# Patient Record
Sex: Female | Born: 1955
Health system: Southern US, Community
[De-identification: ages and names within clinical notes are randomized; demographics above are authoritative.]

## PROBLEM LIST (undated history)

## (undated) DIAGNOSIS — D649 Anemia, unspecified: Secondary | ICD-10-CM

## (undated) DIAGNOSIS — F329 Major depressive disorder, single episode, unspecified: Secondary | ICD-10-CM

## (undated) DIAGNOSIS — F32A Depression, unspecified: Secondary | ICD-10-CM

## (undated) DIAGNOSIS — U071 COVID-19: Secondary | ICD-10-CM

## (undated) DIAGNOSIS — E039 Hypothyroidism, unspecified: Secondary | ICD-10-CM

## (undated) DIAGNOSIS — K219 Gastro-esophageal reflux disease without esophagitis: Secondary | ICD-10-CM

## (undated) DIAGNOSIS — I10 Essential (primary) hypertension: Secondary | ICD-10-CM

## (undated) HISTORY — PX: EYE SURGERY: SHX253

## (undated) HISTORY — DX: Depression, unspecified: F32.A

## (undated) HISTORY — DX: Anemia, unspecified: D64.9

## (undated) HISTORY — DX: Major depressive disorder, single episode, unspecified: F32.9

## (undated) HISTORY — DX: Essential (primary) hypertension: I10

---
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
## 1997-12-18 ENCOUNTER — Encounter: Admission: RE | Admit: 1997-12-18 | Discharge: 1997-12-18 | Payer: Self-pay | Admitting: Family Medicine

## 1998-01-02 ENCOUNTER — Encounter: Admission: RE | Admit: 1998-01-02 | Discharge: 1998-01-02 | Payer: Self-pay | Admitting: Family Medicine

## 1998-01-25 ENCOUNTER — Encounter: Admission: RE | Admit: 1998-01-25 | Discharge: 1998-01-25 | Payer: Self-pay | Admitting: Family Medicine

## 1998-04-06 ENCOUNTER — Encounter: Admission: RE | Admit: 1998-04-06 | Discharge: 1998-04-06 | Payer: Self-pay | Admitting: Family Medicine

## 1999-02-20 ENCOUNTER — Encounter: Admission: RE | Admit: 1999-02-20 | Discharge: 1999-02-20 | Payer: Self-pay | Admitting: Family Medicine

## 1999-08-20 ENCOUNTER — Encounter: Admission: RE | Admit: 1999-08-20 | Discharge: 1999-08-20 | Payer: Self-pay | Admitting: Sports Medicine

## 1999-09-23 ENCOUNTER — Emergency Department (HOSPITAL_COMMUNITY): Admission: EM | Admit: 1999-09-23 | Discharge: 1999-09-24 | Payer: Self-pay | Admitting: Emergency Medicine

## 1999-09-24 ENCOUNTER — Encounter: Payer: Self-pay | Admitting: Emergency Medicine

## 2000-06-08 ENCOUNTER — Encounter: Admission: RE | Admit: 2000-06-08 | Discharge: 2000-06-08 | Payer: Self-pay | Admitting: Family Medicine

## 2000-09-21 ENCOUNTER — Encounter: Admission: RE | Admit: 2000-09-21 | Discharge: 2000-09-21 | Payer: Self-pay | Admitting: Family Medicine

## 2000-11-02 ENCOUNTER — Encounter: Admission: RE | Admit: 2000-11-02 | Discharge: 2000-11-02 | Payer: Self-pay | Admitting: Family Medicine

## 2000-12-03 ENCOUNTER — Emergency Department (HOSPITAL_COMMUNITY): Admission: EM | Admit: 2000-12-03 | Discharge: 2000-12-03 | Payer: Self-pay | Admitting: Emergency Medicine

## 2001-11-08 ENCOUNTER — Encounter: Admission: RE | Admit: 2001-11-08 | Discharge: 2001-11-08 | Payer: Self-pay | Admitting: Family Medicine

## 2001-11-18 ENCOUNTER — Encounter: Admission: RE | Admit: 2001-11-18 | Discharge: 2001-11-18 | Payer: Self-pay | Admitting: Family Medicine

## 2001-11-22 ENCOUNTER — Encounter: Admission: RE | Admit: 2001-11-22 | Discharge: 2001-11-22 | Payer: Self-pay | Admitting: Family Medicine

## 2002-10-25 ENCOUNTER — Encounter: Admission: RE | Admit: 2002-10-25 | Discharge: 2002-10-25 | Payer: Self-pay | Admitting: Family Medicine

## 2002-10-31 ENCOUNTER — Encounter: Admission: RE | Admit: 2002-10-31 | Discharge: 2002-10-31 | Payer: Self-pay | Admitting: Family Medicine

## 2002-11-28 ENCOUNTER — Emergency Department (HOSPITAL_COMMUNITY): Admission: EM | Admit: 2002-11-28 | Discharge: 2002-11-28 | Payer: Self-pay | Admitting: *Deleted

## 2003-12-25 ENCOUNTER — Encounter: Admission: RE | Admit: 2003-12-25 | Discharge: 2003-12-25 | Payer: Self-pay | Admitting: Family Medicine

## 2004-10-15 ENCOUNTER — Ambulatory Visit: Payer: Self-pay | Admitting: Sports Medicine

## 2004-12-18 ENCOUNTER — Ambulatory Visit: Payer: Self-pay | Admitting: Family Medicine

## 2005-01-03 ENCOUNTER — Encounter: Admission: RE | Admit: 2005-01-03 | Discharge: 2005-01-03 | Payer: Self-pay | Admitting: Sports Medicine

## 2005-01-06 ENCOUNTER — Encounter (INDEPENDENT_AMBULATORY_CARE_PROVIDER_SITE_OTHER): Payer: Self-pay | Admitting: *Deleted

## 2005-01-06 LAB — CONVERTED CEMR LAB

## 2005-01-17 ENCOUNTER — Ambulatory Visit: Payer: Self-pay | Admitting: Family Medicine

## 2005-04-17 ENCOUNTER — Ambulatory Visit: Payer: Self-pay | Admitting: Family Medicine

## 2005-09-19 ENCOUNTER — Ambulatory Visit: Payer: Self-pay | Admitting: Sports Medicine

## 2005-09-19 ENCOUNTER — Ambulatory Visit (HOSPITAL_COMMUNITY): Admission: RE | Admit: 2005-09-19 | Discharge: 2005-09-19 | Payer: Self-pay | Admitting: Sports Medicine

## 2005-12-15 ENCOUNTER — Emergency Department (HOSPITAL_COMMUNITY): Admission: EM | Admit: 2005-12-15 | Discharge: 2005-12-15 | Payer: Self-pay | Admitting: Emergency Medicine

## 2006-01-05 ENCOUNTER — Ambulatory Visit: Payer: Self-pay | Admitting: Sports Medicine

## 2006-01-14 ENCOUNTER — Ambulatory Visit: Payer: Self-pay | Admitting: Sports Medicine

## 2006-01-19 ENCOUNTER — Ambulatory Visit: Payer: Self-pay | Admitting: Family Medicine

## 2006-02-03 ENCOUNTER — Ambulatory Visit: Payer: Self-pay | Admitting: Sports Medicine

## 2006-02-10 ENCOUNTER — Encounter: Admission: RE | Admit: 2006-02-10 | Discharge: 2006-02-10 | Payer: Self-pay | Admitting: *Deleted

## 2006-04-20 ENCOUNTER — Emergency Department (HOSPITAL_COMMUNITY): Admission: EM | Admit: 2006-04-20 | Discharge: 2006-04-21 | Payer: Self-pay | Admitting: Emergency Medicine

## 2006-04-22 ENCOUNTER — Emergency Department (HOSPITAL_COMMUNITY): Admission: EM | Admit: 2006-04-22 | Discharge: 2006-04-22 | Payer: Self-pay | Admitting: Family Medicine

## 2006-04-23 ENCOUNTER — Ambulatory Visit: Payer: Self-pay | Admitting: Family Medicine

## 2006-05-15 ENCOUNTER — Ambulatory Visit: Payer: Self-pay | Admitting: Family Medicine

## 2006-06-10 ENCOUNTER — Ambulatory Visit (HOSPITAL_COMMUNITY): Admission: RE | Admit: 2006-06-10 | Discharge: 2006-06-10 | Payer: Self-pay | Admitting: Sports Medicine

## 2006-06-15 ENCOUNTER — Ambulatory Visit: Payer: Self-pay | Admitting: Family Medicine

## 2006-10-07 ENCOUNTER — Ambulatory Visit: Payer: Self-pay | Admitting: Family Medicine

## 2006-10-07 ENCOUNTER — Encounter (INDEPENDENT_AMBULATORY_CARE_PROVIDER_SITE_OTHER): Payer: Self-pay | Admitting: *Deleted

## 2006-10-07 LAB — CONVERTED CEMR LAB: TSH: 4.607 microintl units/mL (ref 0.350–5.50)

## 2006-10-09 ENCOUNTER — Ambulatory Visit: Payer: Self-pay | Admitting: Family Medicine

## 2006-10-16 ENCOUNTER — Ambulatory Visit: Payer: Self-pay | Admitting: Family Medicine

## 2006-10-19 ENCOUNTER — Encounter: Payer: Self-pay | Admitting: Family Medicine

## 2006-10-19 ENCOUNTER — Ambulatory Visit: Payer: Self-pay | Admitting: Family Medicine

## 2006-10-19 LAB — CONVERTED CEMR LAB
Alkaline Phosphatase: 83 units/L (ref 39–117)
Calcium: 10.2 mg/dL (ref 8.4–10.5)
Sodium: 140 meq/L (ref 135–145)

## 2006-10-23 ENCOUNTER — Ambulatory Visit: Payer: Self-pay | Admitting: Family Medicine

## 2006-11-05 DIAGNOSIS — E039 Hypothyroidism, unspecified: Secondary | ICD-10-CM | POA: Insufficient documentation

## 2006-11-05 DIAGNOSIS — I10 Essential (primary) hypertension: Secondary | ICD-10-CM | POA: Insufficient documentation

## 2006-11-05 DIAGNOSIS — K219 Gastro-esophageal reflux disease without esophagitis: Secondary | ICD-10-CM | POA: Insufficient documentation

## 2006-11-05 DIAGNOSIS — E669 Obesity, unspecified: Secondary | ICD-10-CM | POA: Insufficient documentation

## 2006-11-05 DIAGNOSIS — F329 Major depressive disorder, single episode, unspecified: Secondary | ICD-10-CM

## 2006-11-06 ENCOUNTER — Encounter (INDEPENDENT_AMBULATORY_CARE_PROVIDER_SITE_OTHER): Payer: Self-pay | Admitting: *Deleted

## 2007-02-05 ENCOUNTER — Telehealth: Payer: Self-pay | Admitting: Family Medicine

## 2007-02-10 ENCOUNTER — Telehealth: Payer: Self-pay | Admitting: *Deleted

## 2007-02-15 ENCOUNTER — Encounter: Payer: Self-pay | Admitting: *Deleted

## 2007-03-03 ENCOUNTER — Ambulatory Visit: Payer: Self-pay | Admitting: Family Medicine

## 2007-03-03 DIAGNOSIS — F411 Generalized anxiety disorder: Secondary | ICD-10-CM | POA: Insufficient documentation

## 2007-03-03 LAB — CONVERTED CEMR LAB
Glucose, Urine, Semiquant: NEGATIVE
Ketones, urine, test strip: NEGATIVE
WBC Urine, dipstick: NEGATIVE

## 2007-05-21 ENCOUNTER — Encounter (INDEPENDENT_AMBULATORY_CARE_PROVIDER_SITE_OTHER): Payer: Self-pay | Admitting: *Deleted

## 2007-06-02 ENCOUNTER — Telehealth (INDEPENDENT_AMBULATORY_CARE_PROVIDER_SITE_OTHER): Payer: Self-pay | Admitting: Family Medicine

## 2007-06-02 ENCOUNTER — Encounter (INDEPENDENT_AMBULATORY_CARE_PROVIDER_SITE_OTHER): Payer: Self-pay | Admitting: *Deleted

## 2007-06-02 ENCOUNTER — Ambulatory Visit: Payer: Self-pay | Admitting: Family Medicine

## 2007-06-02 ENCOUNTER — Telehealth (INDEPENDENT_AMBULATORY_CARE_PROVIDER_SITE_OTHER): Payer: Self-pay | Admitting: *Deleted

## 2007-06-02 LAB — CONVERTED CEMR LAB: TSH: 2.508 microintl units/mL (ref 0.350–5.50)

## 2007-06-03 ENCOUNTER — Encounter (INDEPENDENT_AMBULATORY_CARE_PROVIDER_SITE_OTHER): Payer: Self-pay | Admitting: *Deleted

## 2007-08-10 ENCOUNTER — Telehealth (INDEPENDENT_AMBULATORY_CARE_PROVIDER_SITE_OTHER): Payer: Self-pay | Admitting: *Deleted

## 2007-08-18 ENCOUNTER — Emergency Department (HOSPITAL_COMMUNITY): Admission: EM | Admit: 2007-08-18 | Discharge: 2007-08-18 | Payer: Self-pay | Admitting: Emergency Medicine

## 2007-12-02 ENCOUNTER — Telehealth (INDEPENDENT_AMBULATORY_CARE_PROVIDER_SITE_OTHER): Payer: Self-pay | Admitting: *Deleted

## 2007-12-29 ENCOUNTER — Ambulatory Visit: Payer: Self-pay | Admitting: Family Medicine

## 2007-12-29 ENCOUNTER — Ambulatory Visit (HOSPITAL_COMMUNITY): Admission: RE | Admit: 2007-12-29 | Discharge: 2007-12-29 | Payer: Self-pay | Admitting: Family Medicine

## 2007-12-29 ENCOUNTER — Encounter: Payer: Self-pay | Admitting: *Deleted

## 2007-12-29 ENCOUNTER — Encounter (INDEPENDENT_AMBULATORY_CARE_PROVIDER_SITE_OTHER): Payer: Self-pay | Admitting: Family Medicine

## 2007-12-29 DIAGNOSIS — R079 Chest pain, unspecified: Secondary | ICD-10-CM

## 2007-12-31 ENCOUNTER — Encounter (INDEPENDENT_AMBULATORY_CARE_PROVIDER_SITE_OTHER): Payer: Self-pay | Admitting: *Deleted

## 2008-01-03 ENCOUNTER — Telehealth: Payer: Self-pay | Admitting: *Deleted

## 2008-01-04 ENCOUNTER — Encounter: Payer: Self-pay | Admitting: *Deleted

## 2008-01-07 ENCOUNTER — Encounter: Payer: Self-pay | Admitting: *Deleted

## 2008-01-07 ENCOUNTER — Ambulatory Visit: Payer: Self-pay | Admitting: Family Medicine

## 2008-01-12 ENCOUNTER — Encounter (INDEPENDENT_AMBULATORY_CARE_PROVIDER_SITE_OTHER): Payer: Self-pay | Admitting: *Deleted

## 2008-01-26 ENCOUNTER — Encounter: Payer: Self-pay | Admitting: Family Medicine

## 2008-01-26 ENCOUNTER — Ambulatory Visit: Payer: Self-pay | Admitting: Family Medicine

## 2008-01-26 ENCOUNTER — Encounter (INDEPENDENT_AMBULATORY_CARE_PROVIDER_SITE_OTHER): Payer: Self-pay | Admitting: *Deleted

## 2008-01-26 DIAGNOSIS — G4700 Insomnia, unspecified: Secondary | ICD-10-CM

## 2008-01-27 LAB — CONVERTED CEMR LAB
AST: 19 units/L (ref 0–37)
Alkaline Phosphatase: 76 units/L (ref 39–117)
BUN: 16 mg/dL (ref 6–23)
Calcium: 9.8 mg/dL (ref 8.4–10.5)
Creatinine, Ser: 1.19 mg/dL (ref 0.40–1.20)
Glucose, Bld: 110 mg/dL — ABNORMAL HIGH (ref 70–99)
HDL: 58 mg/dL (ref >39)
LDL Cholesterol: 90 mg/dL (ref 0–99)
TSH: 5.626 microintl units/mL — ABNORMAL HIGH (ref 0.350–5.50)
Total CHOL/HDL Ratio: 2.8
Triglycerides: 69 mg/dL (ref <150)

## 2008-02-03 ENCOUNTER — Ambulatory Visit: Payer: Self-pay | Admitting: Cardiology

## 2008-02-09 ENCOUNTER — Telehealth: Payer: Self-pay | Admitting: *Deleted

## 2008-02-22 ENCOUNTER — Ambulatory Visit: Payer: Self-pay

## 2008-02-22 ENCOUNTER — Encounter (INDEPENDENT_AMBULATORY_CARE_PROVIDER_SITE_OTHER): Payer: Self-pay | Admitting: *Deleted

## 2008-02-24 ENCOUNTER — Encounter (INDEPENDENT_AMBULATORY_CARE_PROVIDER_SITE_OTHER): Payer: Self-pay | Admitting: *Deleted

## 2008-02-24 ENCOUNTER — Ambulatory Visit: Payer: Self-pay

## 2008-06-19 ENCOUNTER — Emergency Department (HOSPITAL_COMMUNITY): Admission: EM | Admit: 2008-06-19 | Discharge: 2008-06-19 | Payer: Self-pay | Admitting: Family Medicine

## 2008-11-09 ENCOUNTER — Encounter: Payer: Self-pay | Admitting: Family Medicine

## 2008-11-10 ENCOUNTER — Ambulatory Visit: Payer: Self-pay | Admitting: Family Medicine

## 2008-11-10 ENCOUNTER — Encounter: Payer: Self-pay | Admitting: Family Medicine

## 2008-11-10 LAB — CONVERTED CEMR LAB
BUN: 13 mg/dL (ref 6–23)
Chloride: 108 meq/L (ref 96–112)
Creatinine, Ser: 1.19 mg/dL (ref 0.40–1.20)
Glucose, Bld: 108 mg/dL — ABNORMAL HIGH (ref 70–99)
Potassium: 4.1 meq/L (ref 3.5–5.3)

## 2008-11-16 ENCOUNTER — Telehealth: Payer: Self-pay | Admitting: *Deleted

## 2008-12-15 ENCOUNTER — Telehealth: Payer: Self-pay | Admitting: Family Medicine

## 2008-12-29 ENCOUNTER — Ambulatory Visit: Payer: Self-pay | Admitting: Family Medicine

## 2008-12-29 ENCOUNTER — Encounter: Payer: Self-pay | Admitting: Family Medicine

## 2009-01-01 LAB — CONVERTED CEMR LAB: TSH: 7.267 microintl units/mL — ABNORMAL HIGH (ref 0.350–4.500)

## 2009-01-18 ENCOUNTER — Ambulatory Visit: Payer: Self-pay | Admitting: Family Medicine

## 2009-01-22 ENCOUNTER — Telehealth: Payer: Self-pay | Admitting: *Deleted

## 2009-02-06 ENCOUNTER — Telehealth: Payer: Self-pay | Admitting: *Deleted

## 2009-02-07 ENCOUNTER — Ambulatory Visit: Payer: Self-pay | Admitting: Family Medicine

## 2009-02-13 ENCOUNTER — Ambulatory Visit: Payer: Self-pay | Admitting: Family Medicine

## 2009-02-13 ENCOUNTER — Telehealth: Payer: Self-pay | Admitting: Family Medicine

## 2009-03-01 ENCOUNTER — Telehealth: Payer: Self-pay | Admitting: *Deleted

## 2009-09-19 ENCOUNTER — Telehealth: Payer: Self-pay | Admitting: Family Medicine

## 2009-10-08 ENCOUNTER — Emergency Department (HOSPITAL_COMMUNITY): Admission: EM | Admit: 2009-10-08 | Discharge: 2009-10-08 | Payer: Self-pay | Admitting: Emergency Medicine

## 2009-10-19 ENCOUNTER — Telehealth: Payer: Self-pay | Admitting: Family Medicine

## 2009-10-24 ENCOUNTER — Ambulatory Visit: Payer: Self-pay | Admitting: Family Medicine

## 2009-10-24 ENCOUNTER — Telehealth: Payer: Self-pay | Admitting: Family Medicine

## 2009-10-24 DIAGNOSIS — Z87448 Personal history of other diseases of urinary system: Secondary | ICD-10-CM | POA: Insufficient documentation

## 2009-10-24 LAB — CONVERTED CEMR LAB
Blood in Urine, dipstick: NEGATIVE
Ketones, urine, test strip: NEGATIVE
Nitrite: NEGATIVE
Specific Gravity, Urine: 1.01
Urobilinogen, UA: 0.2
WBC Urine, dipstick: NEGATIVE

## 2010-04-05 ENCOUNTER — Ambulatory Visit: Payer: Self-pay | Admitting: Family Medicine

## 2010-04-05 ENCOUNTER — Encounter: Payer: Self-pay | Admitting: Family Medicine

## 2010-04-05 LAB — CONVERTED CEMR LAB
AST: 23 units/L (ref 0–37)
Albumin: 4.3 g/dL (ref 3.5–5.2)
Alkaline Phosphatase: 79 units/L (ref 39–117)
BUN: 22 mg/dL (ref 6–23)
Creatinine, Ser: 1.34 mg/dL — ABNORMAL HIGH (ref 0.40–1.20)
Glucose, Bld: 105 mg/dL — ABNORMAL HIGH (ref 70–99)
HCT: 30.7 % — ABNORMAL LOW (ref 36.0–46.0)
HDL: 54 mg/dL (ref >39)
Hemoglobin: 10.3 g/dL — ABNORMAL LOW (ref 12.0–15.0)
LDL Cholesterol: 107 mg/dL — ABNORMAL HIGH (ref 0–99)
MCHC: 33.6 g/dL (ref 30.0–36.0)
MCV: 83.9 fL (ref 78.0–100.0)
RBC: 3.66 M/uL — ABNORMAL LOW (ref 3.87–5.11)
RDW: 13.8 % (ref 11.5–15.5)
TSH: 8.01 microintl units/mL — ABNORMAL HIGH (ref 0.350–4.500)
Total Bilirubin: 0.3 mg/dL (ref 0.3–1.2)
Total CHOL/HDL Ratio: 3.2
Triglycerides: 52 mg/dL (ref <150)

## 2010-04-08 ENCOUNTER — Encounter: Payer: Self-pay | Admitting: Family Medicine

## 2010-04-08 DIAGNOSIS — D649 Anemia, unspecified: Secondary | ICD-10-CM

## 2010-04-08 LAB — CONVERTED CEMR LAB: TIBC: 305 ug/dL (ref 250–470)

## 2010-04-15 ENCOUNTER — Telehealth: Payer: Self-pay | Admitting: Family Medicine

## 2010-04-15 DIAGNOSIS — N183 Chronic kidney disease, stage 3 (moderate): Secondary | ICD-10-CM

## 2010-08-10 ENCOUNTER — Telehealth: Payer: Self-pay | Admitting: Family Medicine

## 2010-09-29 ENCOUNTER — Encounter: Payer: Self-pay | Admitting: Family Medicine

## 2010-10-10 NOTE — Progress Notes (Signed)
   Phone Note Call from Patient   Caller: Patient Summary of Call: Patient complains of feeling tired, "feels drunk."  Stated she ran out of Synthroid on Tuesday of this week.  Started feeling bad on Thursday.  Complains of tiredness, headache.  Has tried taking Tylenol with inconsistent relief for headache.  Would like several day refill of Synthroid until she can be seen this week.  Will refill for 1 week and told patient to call Mon for appt.  Patient agreed with plan, states she will call first thing Monday.  Initial call taken by: Renold Don MD,  August 10, 2010 2:15 PM    Prescriptions: SYNTHROID 125 MCG TABS (LEVOTHYROXINE SODIUM) Take 1 tablet by mouth once a day.  #7 x 0   Entered and Authorized by:   Renold Don MD   Signed by:   Renold Don MD on 08/10/2010   Method used:   Electronically to        [facility] [phone]* (retail)       [address]       [place], [place]  [address]       Ph: [phone]       Fax: [phone]   RxID:   [id number]

## 2010-10-10 NOTE — Progress Notes (Signed)
Summary: triage: UTI   Phone Note Call from Patient Call back at Home Phone [phone]   Caller: Patient Summary of Call: was sick last week and went to ED for UTI and gave her Cephalexin 500mg  thinks she is allergic to is and wants another antibiotic for this [facility] [address] Initial call taken by: De Nurse,  October 19, 2009 10:19 AM  Follow-up for Phone Call        states she had a back ache & her throat was closing up when she was taking the antibiotic. stopped the pill & the symptoms went away. appt made for 3pm for recheck.   pcp to add this to her allergies Follow-up by: Golden Circle RN,  October 19, 2009 10:27 AM

## 2010-10-10 NOTE — Progress Notes (Signed)
Summary: triage   Phone Note Call from Patient Call back at Home Phone [phone]   Caller: Patient Summary of Call: Pt thinks she has a UTI can she be worked in? Initial call taken by: Clydell Hakim,  October 24, 2009 9:46 AM  Follow-up for Phone Call        c/o UTI symptoms. had been seen in ED for this but stopped the antibiotics due to an allergic reax. needs to be seen to see if she still has it & to get alternative med  Follow-up by: Golden Circle RN,  October 24, 2009 9:51 AM

## 2010-10-10 NOTE — Assessment & Plan Note (Signed)
Summary: f/up,tcb   Vital Signs:  Patient profile:   55 year old female Height:      61.5 inches Weight:      249 pounds BMI:     46.45 Temp:     98.3 degrees F oral Pulse rate:   67 / minute BP sitting:   146 / 82  (left arm) Cuff size:   large  Vitals Entered By: Garen Grams LPN (April 05, 2010 9:01 AM)  Serial Vital Signs/Assessments:  Time      Position  BP       Pulse  Resp  Temp     By                     130/80                         Delbert Harness MD  CC: f/u meds Is Patient Diabetic? No Pain Assessment Patient in pain? no        Primary Care Provider:  Delbert Harness MD  CC:  f/u meds.  History of Present Illness: 55 yo here for follow-up of chronic medical conditions  HYPERTENSION Meds: Taking and tolerating? yes Home BP's: yes- usually in 130/80 range per patient Chest Pain: no Dyspnea: no  hypothyroidism:  has been out of meds for one week.   No skin/hair changes, temp intolerance, constipation, fatigue.  depression/anxiety:  feels like she is doing well and would like to continue stable dose of fluoxetine.  Habits & Providers  Alcohol-Tobacco-Diet     Tobacco Status: never  Current Medications (verified): 1)  Synthroid 125 Mcg Tabs (Levothyroxine Sodium) .... Take 1 Tablet By Mouth Once A Day. 2)  Lisinopril-Hydrochlorothiazide 20-12.5 Mg Tabs (Lisinopril-Hydrochlorothiazide) .... 2 Tablets By Mouth Daily. Needs Appointment For Further Refills 3)  Coreg 3.125 Mg  Tabs (Carvedilol) .Marland Kitchen.. 1 By Mouth Two Times A Day. Needs Appointment For Further Refills 4)  Fluoxetine Hcl 20 Mg  Caps (Fluoxetine Hcl) .Marland Kitchen.. 1 By Mouth Every Day Even If You Feel Ok (For Depression and Anxiety) 5)  Prilosec Otc 20 Mg Tbec (Omeprazole Magnesium) .... One By Mouth Daily 6)  Anacin 81 Mg Tbec (Aspirin) .... One Daily  Allergies: 1)  ! Benadryl PMH-FH-SH reviewed-no changes except otherwise noted  Review of Systems      See HPI  Physical Exam  General:  VSS, BP normal  on recheck Well-developed,well-nourished,in no acute distress; alert,appropriate and cooperative throughout examination.   Lungs:  Normal respiratory effort, chest expands symmetrically. Lungs are clear to auscultation, no crackles or wheezes. Heart:  Normal rate and regular rhythm. S1 and S2 normal without gallop, murmur, click, rub or other extra sounds. Extremities:  no LE edema   Impression & Recommendations:  Problem # 1:  HYPOTHYROIDISM, UNSPECIFIED (ICD-244.9) Assessment Unchanged Will recheck today  Her updated medication list for this problem includes:    Synthroid 125 Mcg Tabs (Levothyroxine sodium) .Marland Kitchen... Take 1 tablet by mouth once a day.  Orders: TSH-[facility] ([id number]) [facility]- Est  Level 4 (56213)  Problem # 2:  HYPERTENSION, BENIGN SYSTEMIC (ICD-401.1) Assessment: Unchanged At goal.  Her updated medication list for this problem includes:    Lisinopril-hydrochlorothiazide 20-12.5 Mg Tabs (Lisinopril-hydrochlorothiazide) .Marland Kitchen... 2 tablets by mouth daily. needs appointment for further refills    Coreg 3.125 Mg Tabs (Carvedilol) .Marland Kitchen... 1 by mouth two times a day. needs appointment for further refills  Orders: Comp Met-[facility] [id number]) Lipid-[facility] [id number]) CBC-[facility] [phone])  [facility]- Est  Level 4 (11914)  Problem # 3:  DEPRESSIVE DISORDER, NOS (ICD-311) Assessment: Unchanged  Continue current meds  Her updated medication list for this problem includes:    Fluoxetine Hcl 20 Mg Caps (Fluoxetine hcl) .Marland Kitchen... 1 by mouth every day even if you feel ok (for depression and anxiety)  Orders: [facility]- Est  Level 4 ([address]295)  Complete Medication List: 1)  Synthroid 125 Mcg Tabs (Levothyroxine sodium) .... Take 1 tablet by mouth once a day. 2)  Lisinopril-hydrochlorothiazide 20-12.5 Mg Tabs (Lisinopril-hydrochlorothiazide) .... 2 tablets by mouth daily. needs appointment for further refills 3)  Coreg 3.125 Mg Tabs (Carvedilol) .Marland Kitchen.. 1 by mouth two times a day. needs appointment for further  refills 4)  Fluoxetine Hcl 20 Mg Caps (Fluoxetine hcl) .Marland Kitchen.. 1 by mouth every day even if you feel ok (for depression and anxiety) 5)  Prilosec Otc 20 Mg Tbec (Omeprazole magnesium) .... One by mouth daily 6)  Anacin 81 Mg Tbec (Aspirin) .... One daily  Patient Instructions: 1)  Please make lab appointment to recheck your thyroid in 6 weeks 2)  Work on taking your medicines everyday and checking your blood pressure several times a week.  If you notice it is greater than 140/90, please come in sooner. 3)  Make appointment to follow-up in 2-3 months 4)  See information on talking to Jaynee Eagles for financial assistance Prescriptions: SYNTHROID 125 MCG TABS (LEVOTHYROXINE SODIUM) Take 1 tablet by mouth once a day.  #30 x 1   Entered and Authorized by:   Delbert Harness MD   Signed by:   Delbert Harness MD on 04/05/2010   Method used:   Electronically to        [facility] [phone]* (retail)       [address]       [place], [place]  [address]       Ph: [phone]       Fax: [phone]   RxID:   [id number]    Prevention & Chronic Care Immunizations   Influenza vaccine: Not documented    Tetanus booster: 11/06/2001: Done.   Tetanus booster due: 11/07/2011    Pneumococcal vaccine: Not documented  Colorectal Screening   Hemoccult: Not documented    Colonoscopy: Not documented  Other Screening   Pap smear: Done.  (01/06/2005)   Pap smear due: 01/06/2006    Mammogram: Done.  (02/12/2006)   Mammogram due: 02/13/2007   Smoking status: never  (04/05/2010)  Lipids   Total Cholesterol: 162  (01/26/2008)   LDL: 90  (01/26/2008)   LDL Direct: Not documented   HDL: 58  (01/26/2008)   Triglycerides: 69  (01/26/2008)  Hypertension   Last Blood Pressure: 146 / 82  (04/05/2010)   Serum creatinine: 1.19  (11/10/2008)   Serum potassium 4.1  (11/10/2008) CMP ordered     Hypertension flowsheet reviewed?: Yes   Progress toward BP goal: At goal  Self-Management Support :     Patient will work on the following items until the next clinic visit to reach self-care goals:     Medications and monitoring: take my medicines every day, check my blood pressure  (04/05/2010)     Eating: drink diet soda or water instead of juice or soda, eat more vegetables, use fresh or frozen vegetables, eat foods that are low in salt, eat baked foods instead of fried foods, limit or avoid alcohol  (04/05/2010)    Hypertension self-management support: Written self-care plan  (04/05/2010)   Hypertension  self-care plan printed.   Appended Document: Orders Update     Clinical Lists Changes  Problems: Added new problem of UNSPECIFIED ANEMIA (ICD-285.9) Orders: Added new Test order of Ferritin-[facility] [id number]) - Signed Added new Test order of Iron -[facility] [phone]) - Signed Added new Test order of Iron Binding Cap (TIBC)-[facility] ([id number]) - Signed

## 2010-10-10 NOTE — Assessment & Plan Note (Signed)
Summary: UTI   Vital Signs:  Patient profile:   55 year old female Height:      61.5 inches Weight:      246.19 pounds BMI:     45.93 Temp:     98.7 degrees F oral Pulse rate:   93 / minute BP sitting:   139 / 77  (left arm)  Vitals Entered By: Terese Door (October 24, 2009 4:10 PM) CC: UTI- DX in ER 1 week ago. GIven Keflex Is Patient Diabetic? No Pain Assessment Patient in pain? no        Primary Care Provider:  Delbert Harness MD  CC:  UTI- DX in ER 1 week ago. GIven Keflex.  History of Present Illness: UTI: Pt her for concern that her UTI was not completely treated.  Pt dx with UTI in ER 1 week ago.  Was started on Keflex x7 days.  She was having dysuria and frequency when she was seen in the ER and dx with UTI.  She took 4 doses of antibiotic and began to have lower back pain and "swelling" of the bridge of her nose.  She states that she had no shortness of breath or rashes.  She symptoms were transient and resolved quickly but she was concerned that they may have been caused by an allergic reaction therefore she stopped taking the antibiotic after 4 doses.  She states that she no longer has symptoms of a UTI but is concerned that her infection wasn't completely treated.    Habits & Providers  Alcohol-Tobacco-Diet     Tobacco Status: never  Current Medications (verified): 1)  Synthroid 125 Mcg Tabs (Levothyroxine Sodium) .... Take 1 Tablet By Mouth Once A Day 2)  Lisinopril-Hydrochlorothiazide 20-12.5 Mg Tabs (Lisinopril-Hydrochlorothiazide) .... 2 Tablets By Mouth Daily 3)  Coreg 3.125 Mg  Tabs (Carvedilol) .Marland Kitchen.. 1 By Mouth Bid 4)  Fluoxetine Hcl 20 Mg  Caps (Fluoxetine Hcl) .Marland Kitchen.. 1 By Mouth Every Day Even If You Feel Ok (For Depression and Anxiety) 5)  Prilosec Otc 20 Mg Tbec (Omeprazole Magnesium) .... One By Mouth Daily 6)  Anacin 81 Mg Tbec (Aspirin) .... One Daily 7)  Ibuprofen 600 Mg Tabs (Ibuprofen) .... One Tab By Mouth Q6 Hours As Needed For Pain 8)   Amoxicillin-Pot Clavulanate 875-125 Mg Tabs (Amoxicillin-Pot Clavulanate) .Marland Kitchen.. 1 Tab By Mouth Two Times A Day For 10 Days  Allergies (verified): 1)  ! Benadryl  Review of Systems  The patient denies anorexia, fever, weight loss, hoarseness, chest pain, syncope, peripheral edema, prolonged cough, and abdominal pain.         No urinary frequency, retention, or dysuria.  Physical Exam  General:  VSS Well-developed,well-nourished,in no acute distress; alert,appropriate and cooperative throughout examination Lungs:  Normal respiratory effort, chest expands symmetrically. Lungs are clear to auscultation, no crackles or wheezes. Heart:  Normal rate and regular rhythm. S1 and S2 normal without gallop, murmur, click, rub or other extra sounds. Abdomen:  Bowel sounds positive,abdomen soft and non-tender without masses, organomegaly or hernias noted.   Impression & Recommendations:  Problem # 1:  UTI (ICD-599.0) Reassured pt that since she had 4 doses of antibiotics and since she has had no further symptoms of UTI that her infection was most likely adequately treated with the 4 doses of antibiotics.  u/a obtained today was WNL.  Pt notified of results.     We also discussed that the symptoms she was having could have been from other causes and do not necessarily  indicate that she is allergy  to keflex.     Her updated medication list for this problem includes:    Amoxicillin-pot Clavulanate 875-125 Mg Tabs (Amoxicillin-pot clavulanate) .Marland Kitchen... 1 tab by mouth two times a day for 10 days  Orders: Urinalysis-[facility] (00000) [facility]- Est Level  3 ([id number])  Complete Medication List: 1)  Synthroid 125 Mcg Tabs (Levothyroxine sodium) .... Take 1 tablet by mouth once a day 2)  Lisinopril-hydrochlorothiazide 20-12.5 Mg Tabs (Lisinopril-hydrochlorothiazide) .... 2 tablets by mouth daily 3)  Coreg 3.125 Mg Tabs (Carvedilol) .Marland Kitchen.. 1 by mouth bid 4)  Fluoxetine Hcl 20 Mg Caps (Fluoxetine hcl) .Marland Kitchen.. 1 by mouth  every day even if you feel ok (for depression and anxiety) 5)  Prilosec Otc 20 Mg Tbec (Omeprazole magnesium) .... One by mouth daily 6)  Anacin 81 Mg Tbec (Aspirin) .... One daily 7)  Ibuprofen 600 Mg Tabs (Ibuprofen) .... One tab by mouth q6 hours as needed for pain 8)  Amoxicillin-pot Clavulanate 875-125 Mg Tabs (Amoxicillin-pot clavulanate) .Marland Kitchen.. 1 tab by mouth two times a day for 10 days  Laboratory Results   Urine Tests  Date/Time Received: October 24, 2009 4:04 PM  Date/Time Reported: October 24, 2009 4:25 PM   Routine Urinalysis   Color: yellow Appearance: Clear Glucose: negative   (Normal Range: Negative) Bilirubin: negative   (Normal Range: Negative) Ketone: negative   (Normal Range: Negative) Spec. Gravity: 1.010   (Normal Range: 1.003-1.035) Blood: negative   (Normal Range: Negative) pH: 5.5   (Normal Range: 5.0-8.0) Protein: negative   (Normal Range: Negative) Urobilinogen: 0.2   (Normal Range: 0-1) Nitrite: negative   (Normal Range: Negative) Leukocyte Esterace: negative   (Normal Range: Negative)    Comments: ...............test performed by......Marland KitchenBonnie A. Swaz[place], MLS (ASCP)cm

## 2010-10-10 NOTE — Progress Notes (Signed)
Summary: refill: fluoxetine   Phone Note Refill Request Call back at Home Phone [phone] Message from:  Patient  Refills Requested: Medication #1:  FLUOXETINE HCL 20 MG  CAPS 1 by mouth every day even if you feel ok (for depression and anxiety) also wants nerve pill  Initial call taken by: De Nurse,  September 19, 2009 11:13 AM  Follow-up for Phone Call        will forward message to MD.    Follow-up by: Theresia Lo RN,  September 19, 2009 11:20 AM  Additional Follow-up for Phone Call Additional follow up Details #1::        Refilled prozac.  This should help reduce anxiety when taken regularly every day.   If she thinks she needs other "nerve pills" please have her make appointment for further evaluation. Additional Follow-up by: Delbert Harness MD,  September 19, 2009 6:51 PM    Prescriptions: FLUOXETINE HCL 20 MG  CAPS (FLUOXETINE HCL) 1 by mouth every day even if you feel ok (for depression and anxiety)  #30 x 3   Entered and Authorized by:   Delbert Harness MD   Signed by:   Delbert Harness MD on 09/19/2009   Method used:   Electronically to        [facility] [phone]* (retail)       [address]       [place], [place]  [address]       Ph: [phone]       Fax: [phone]   RxID:   [id number]

## 2010-10-10 NOTE — Progress Notes (Signed)
Summary: Abnormal Labs   Phone Note Outgoing Call   Summary of Call: Attempted to call patient regarding labwork- her phone does not accept blocked calls- will call back tomorrow from clinic  Initial call taken by: Delbert Harness MD,  April 15, 2010 4:57 PM  Follow-up for Phone Call        Spoke with patient.  told of lab results- will plan to recheck in mid september a few days before her next appt and will discuss plan at tha appt.  patient expressed understanding.  no further questions.  Follow-up by: Delbert Harness MD,  April 16, 2010 9:26 AM  New Problems: RENAL INSUFFICIENCY (ICD-588.9)   New Problems: RENAL INSUFFICIENCY (ICD-588.9)   Hypertension History:      Positive major cardiovascular risk factors include hypertension.  Negative major cardiovascular risk factors include female age less than 45 years old and non-tobacco-user status.     Impression & Recommendations:  Problem # 1:  HYPOTHYROIDISM, UNSPECIFIED (ICD-244.9)  abnormal lab- in setting  of poor compliance.  Will recheck in 6 weeks after she resumes taking meds.  Her updated medication list for this problem includes:    Synthroid 125 Mcg Tabs (Levothyroxine sodium) .Marland Kitchen... Take 1 tablet by mouth once a day.  Future Orders: CBC w/Diff-[facility] (56213) ... 04/25/2011 TSH-[facility] [id number]) ... 04/18/2011  Problem # 2:  UNSPECIFIED ANEMIA (ICD-285.9)  Normocytic, not iron deficiency.  Likely due to chronic disease- Last GFR 53.    Future Orders: CBC w/Diff-[facility] ([id number]) ... 04/25/2011  Problem # 3:  RENAL INSUFFICIENCY (ICD-588.9) Unsure if it is acute of chronic as the last Cr I have is > 1 year ago.  According to Cr 1.34 in MDRD calculator would have GFR 53.  Will recheck with TSH in 6 weeks to reassess.  Complete Medication List: 1)  Synthroid 125 Mcg Tabs (Levothyroxine sodium) .... Take 1 tablet by mouth once a day. 2)  Lisinopril-hydrochlorothiazide 20-12.5 Mg Tabs (Lisinopril-hydrochlorothiazide) .... 2  tablets by mouth daily. needs appointment for further refills 3)  Coreg 3.125 Mg Tabs (Carvedilol) .Marland Kitchen.. 1 by mouth two times a day. needs appointment for further refills 4)  Fluoxetine Hcl 20 Mg Caps (Fluoxetine hcl) .Marland Kitchen.. 1 by mouth every day even if you feel ok (for depression and anxiety) 5)  Prilosec Otc 20 Mg Tbec (Omeprazole magnesium) .... One by mouth daily 6)  Anacin 81 Mg Tbec (Aspirin) .... One daily  Other Orders: Future Orders: Basic Met-[facility] [id number]) ... 04/25/2011 UA Microalbumin-[facility] ([id number]) ... 04/26/2011  Hypertension Assessment/Plan:      The patient's hypertensive risk group is category A: No risk factors and no target organ damage.  Her calculated 10 year risk of coronary heart disease is 7 %.

## 2010-10-17 ENCOUNTER — Other Ambulatory Visit: Payer: Self-pay | Admitting: Family Medicine

## 2010-10-17 DIAGNOSIS — F329 Major depressive disorder, single episode, unspecified: Secondary | ICD-10-CM

## 2010-10-17 MED ORDER — FLUOXETINE HCL 20 MG PO CAPS: 20.0000 mg | ORAL_CAPSULE | Freq: Every day | ORAL | Status: DC

## 2010-11-25 LAB — URINALYSIS, ROUTINE W REFLEX MICROSCOPIC
Bilirubin Urine: NEGATIVE
Ketones, ur: NEGATIVE mg/dL
Protein, ur: NEGATIVE mg/dL
Urobilinogen, UA: 0.2 mg/dL (ref 0.0–1.0)

## 2010-11-25 LAB — DIFFERENTIAL
Lymphocytes Relative: 5 % — ABNORMAL LOW (ref 12–46)
Monocytes Absolute: 0.6 10*3/uL (ref 0.1–1.0)
Monocytes Relative: 4 % (ref 3–12)
Neutro Abs: 15.6 10*3/uL — ABNORMAL HIGH (ref 1.7–7.7)

## 2010-11-25 LAB — COMPREHENSIVE METABOLIC PANEL
Albumin: 3.8 g/dL (ref 3.5–5.2)
BUN: 12 mg/dL (ref 6–23)
Calcium: 9.4 mg/dL (ref 8.4–10.5)
Creatinine, Ser: 1.16 mg/dL (ref 0.4–1.2)
Total Protein: 8.2 g/dL (ref 6.0–8.3)

## 2010-11-25 LAB — CBC
HCT: 32.5 % — ABNORMAL LOW (ref 36.0–46.0)
MCHC: 33.8 g/dL (ref 30.0–36.0)
MCV: 85.5 fL (ref 78.0–100.0)
Platelets: 197 10*3/uL (ref 150–400)
RDW: 12.7 % (ref 11.5–15.5)

## 2010-11-25 LAB — URINE CULTURE: Colony Count: >=100000

## 2010-11-25 LAB — POCT CARDIAC MARKERS

## 2010-11-25 LAB — URINE MICROSCOPIC-ADD ON

## 2011-01-21 ENCOUNTER — Encounter: Payer: Self-pay | Admitting: Sports Medicine

## 2011-01-21 NOTE — Assessment & Plan Note (Signed)
[facility]                            CARDIOLOGY OFFICE NOTE   NAME:Gwendolyn Hicks, Gwendolyn Hicks                        MRN:          [id number]  DATE:02/03/2008                            DOB:          1956-03-19    The patient is a 55 year old female who I am asked to evaluate for chest  pain and dyspnea.  She has no prior cardiac history.  She apparently did  have an exercise treadmill ordered approximately 1 year ago at the  hospital, but this was unable to be performed as she could not exercise.  The patient does state that she has problems with anxiety.  She states  that when she feels anxiety she feels a chest tightness.  This does not  radiate.  It is not pleuritic or positional, nor is it related to food.  It improves when her anxiety improves.  She does have some nausea and  shortness of breath with this and diaphoresis.  Note, she does not have  exertional chest pain; however, she does have some dyspnea on exertion.  There is no orthopnea or PND, but there is occasional mild pedal edema.  Because of the above, we were asked to further evaluate.   Her medications at present include:  1. Fluoxetine 20 mg p.o. daily.  2. Prilosec daily.  3. Lisinopril/hydrochlorothiazide 20/12.5 daily.  4. Coreg 3.125 mg p.o. b.i.d.  5. Levothyroxine 125 mcg p.o. daily.  6. She also takes Maalox p.r.n.  7. Clonazepam p.r.n.   She has an allergy to BENADRYL.   SOCIAL HISTORY:  She does not smoke nor does she consume alcohol.  She  is married and has 5 children.   FAMILY HISTORY:  Her family history is strongly positive for coronary  artery disease.  Her mother, apparently, had a myocardial infarction in  her 33s, and her father in his early 41s.   PAST MEDICAL HISTORY:  Significant for hypertension, but there is no  diabetes mellitus or hyperlipidemia.  She does have hypothyroidism.  She  has a history of gastroesophageal reflux disease as well as gastritis.  She  has a history of anxiety.  She has had prior C-section as well as  tonsillectomy.  There is no other significant past medical history  noted.   REVIEW OF SYSTEMS:  She denies any headache, fevers, or chills.  There  is no productive cough of hemoptysis.  There is no dysphagia,  odynophagia, melena or hematochezia.  There is no dysuria or hematuria.  There is no rash or seizure activity.  There is no orthopnea, PND, but  there can be occasional mild pedal edema.  She does have some pain in  her left hip with ambulation due to arthritis.  The remaining systems  are negative.   PHYSICAL EXAMINATION:  She weighs 261 pounds and her blood pressure is  143/90, and her pulse is 80.  She is well developed and morbidly obese.  She does not appear to be in  any acute distress at present.  Her skin is warm and dry.  She does appear to be  anxious although not depressed.  There is no peripheral clubbing.  Her back is normal.  Her HEENT is normal with normal eyelids.  Her neck is supple with a normal upstroke bilaterally.  There is a left  carotid bruit.  I could not appreciate jugular venous distension or  thyromegaly.  Her chest is clear to auscultation, normal expansion.  Her cardiovascular exam reveals a regular rate and rhythm with a normal  S1 and S2.  There are no murmurs, rubs, or gallops noted.  There is no  change in the Valsalva.  ABDOMINAL EXAM:  Nontender, nondistended, positive bowel sounds, no  hepatosplenomegaly, no masses appreciated.  There is no abdominal bruit.  She has 2+ femoral pulses bilaterally.  No bruits.  EXTREMITIES:  Show no edema, I could palpate no cords.  She has 2+  dorsalis pedis pulses bilaterally.  Her neurological exam is grossly intact.   I do have laboratories drawn by her primary care physician from Jan 26, 2008.  A BUN and creatinine were normal and her potassium was 4.3.  Her  total cholesterol was 162 with an HDL of 58 and an LDL of 90.  A TSH was   mildly elevated at 5.626.   We also have an electrocardiogram dated December 29, 2007 that showed a  normal sinus rhythm of no significant ST changes.   DIAGNOSES:  1. Atypical chest pain:  [name] is having symptoms predominantly      with anxiety.  However, she does have a strong family history of      coronary disease.  Note, she is not able to exercise, and we will,      therefore, schedule her to have an adenosine Myoview for risk      stratification.  If she has no ischemia then we will not pursue      further cardiac evaluation.  2. Dyspnea:  This is most likely due to deconditioning and obesity.      However, we will quantify her left ventricular function at the time      of her Myoview.  3. Left carotid bruit:  We will schedule her to have carotid Dopplers.  4. Hypertension:  Her blood pressure is elevated today; however, she      has not taken her lisinopril hydrochlorothiazide as of yet.  She      will track this and this can be adjusted by her primary care      physician.  5. Hypothyroidism:  Management per her primary care physician.  6. Gastroesophageal reflux disease:  Management per her primary care      physician.  7. History of anxiety:  Also management per her primary care      physician.   We will see her back on an as-needed basis pending the result of her  Myoview and carotid Dopplers.     Madolyn Frieze Jens Som, MD, [facility]  Electronically Signed    BSC/MedQ  DD: 02/03/2008  DT: 02/03/2008  Job #: [id number]   cc:   Gwendolyn Hicks, M.D.

## 2011-03-14 ENCOUNTER — Other Ambulatory Visit: Payer: Self-pay | Admitting: Family Medicine

## 2011-03-14 DIAGNOSIS — F329 Major depressive disorder, single episode, unspecified: Secondary | ICD-10-CM

## 2011-03-14 DIAGNOSIS — E039 Hypothyroidism, unspecified: Secondary | ICD-10-CM

## 2011-03-14 DIAGNOSIS — I10 Essential (primary) hypertension: Secondary | ICD-10-CM

## 2011-03-14 MED ORDER — LEVOTHYROXINE SODIUM 125 MCG PO TABS: 125.0000 ug | ORAL_TABLET | Freq: Every day | ORAL | Status: DC

## 2011-03-14 MED ORDER — LISINOPRIL-HYDROCHLOROTHIAZIDE 20-12.5 MG PO TABS: 2.0000 | ORAL_TABLET | Freq: Every day | ORAL | Status: DC

## 2011-03-14 MED ORDER — FLUOXETINE HCL 20 MG PO CAPS: 20.0000 mg | ORAL_CAPSULE | Freq: Every day | ORAL | Status: DC

## 2011-03-14 MED ORDER — OMEPRAZOLE 20 MG PO CPDR: 20.0000 mg | DELAYED_RELEASE_CAPSULE | Freq: Every day | ORAL | Status: DC

## 2011-03-14 MED ORDER — CARVEDILOL 3.125 MG PO TABS: 3.1250 mg | ORAL_TABLET | Freq: Two times a day (BID) | ORAL | Status: DC

## 2011-04-07 ENCOUNTER — Ambulatory Visit: Payer: Self-pay | Admitting: Family Medicine

## 2011-07-25 ENCOUNTER — Ambulatory Visit (INDEPENDENT_AMBULATORY_CARE_PROVIDER_SITE_OTHER): Payer: Medicare Other | Admitting: Family Medicine

## 2011-07-25 ENCOUNTER — Encounter: Payer: Self-pay | Admitting: Family Medicine

## 2011-07-25 VITALS — BP 139/80 | HR 84 | Temp 99.0°F | Ht 61.5 in | Wt 255.4 lb

## 2011-07-25 DIAGNOSIS — B9789 Other viral agents as the cause of diseases classified elsewhere: Secondary | ICD-10-CM

## 2011-07-25 DIAGNOSIS — B349 Viral infection, unspecified: Secondary | ICD-10-CM | POA: Insufficient documentation

## 2011-07-25 NOTE — Patient Instructions (Signed)
Viral Infections A virus is a type of germ. Viruses can cause:  Minor sore throats.   Aches and pains.   Headaches.   Runny nose.   Rashes.   Watery eyes.   Tiredness.   Coughs.   Loss of appetite.   Feeling sick to your stomach (nausea).   Throwing up (vomiting).   Watery poop (diarrhea).  HOME CARE   Only take medicines as told by your doctor.   Drink enough water and fluids to keep your pee (urine) clear or pale yellow. Sports drinks are a good choice.   Get plenty of rest and eat healthy. Soups and broths with crackers or rice are fine.  GET HELP RIGHT AWAY IF:   You have a very bad headache.   You have shortness of breath.   You have chest pain or neck pain.   You have an unusual rash.   You cannot stop throwing up.   You have watery poop that does not stop.   You cannot keep fluids down.     MAKE SURE YOU:   Understand these instructions.   Will watch this condition.   Will get help right away if you are not doing well or get worse.  Document Released: 08/07/2008 Document Revised: 05/07/2011 Document Reviewed: 12/31/2010 [employer] Patient Information 2012 [employer]. 

## 2011-07-25 NOTE — Assessment & Plan Note (Signed)
Flu like sxs x 1 week which are progressively improving. Discussed supportive care. Instructed pt to come back in 1 week for flu shot as pt is still in sub-acute phase of viral illness. Pt agreeable to plan.

## 2011-07-25 NOTE — Progress Notes (Signed)
  Subjective:    Patient ID: Gwendolyn Hicks, female    DOB: February 17, 1956, 55 y.o.   MRN: [id number]  HPI Flu like sxs over last week. + cough, nasal congestion, rhinorrhea, generalized malaise. + sick contacts in grandchildren with similar sxs. Pt states that sxs were much worse at the beginning of the week, and sxs have seemed to progressively improve. Pt has been using honey-lemon tea to help with sxs, which pt states has been effective. Pt has not had a flu shot. Pt is a non-smoker.    Review of Systems See HPI     Objective:   Physical Exam Gen: up in chair, NAD HEENT: NCAT, EOMI, TMs clear bilaterally, + nasal erythema bilaterally, + post oropharyngeal erythema  CV: RRR, no murmurs auscultated PULM: CTAB, no wheezes, rales, rhoncii ABD: S/NT/+ bowel sounds  EXT: 2+ peripheral pulses   Assessment & Plan:

## 2011-08-25 ENCOUNTER — Encounter: Payer: Self-pay | Admitting: Home Health Services

## 2011-08-25 ENCOUNTER — Ambulatory Visit (INDEPENDENT_AMBULATORY_CARE_PROVIDER_SITE_OTHER): Payer: Medicare Other | Admitting: Home Health Services

## 2011-08-25 VITALS — BP 123/79 | HR 64 | Temp 98.5°F | Ht 61.5 in | Wt 256.3 lb

## 2011-08-25 DIAGNOSIS — Z0000 Encounter for general adult medical examination without abnormal findings: Secondary | ICD-10-CM

## 2011-08-25 NOTE — Progress Notes (Signed)
Patient here for annual wellness visit, patient reports: Risk Factors/Conditions needing evaluation or treatment: PT scored 11 on PHQ-9.  Pt is currently taking Prozac sporadically, encouraged her to take daily as prescribed, also gave resource to contact Spero Geralds, clinic phycologist.  Home Safety: Pt lives with husband, daughter, 6 grandchildren in 1 story home.  Pt does not have smoke detectors or adaptive equipment in bathroom. Other Information: Corrective lens: Pt wears reading glasses but has not gone to eye doctor in 30 years.  Gave resource to [facility]. Dentures: Pt does not have dentures but expresses a desire to see a dentist but does not have funds.  Gave resource to [facility] free dental clinic. Memory: Pt reports some memory problems.  Things seem to "go away" from her. Patient's Mini Mental Score (recorded in doc. flowsheet): 24  Pt completed 9th grade which may have influenced mini-mental score.  Balance/Gait: Pt has narrow base, expressed pain in lower back and in knees.  Balance Abnormal Patient value  Sitting balance    Sit to stand    Attempts to arise    Immediate standing balance    Standing balance    Nudge    Eyes closed- Romberg    Tandem stance x unable  Back lean    Neck Rotation    360 degree turn    Sitting down x Uses arms   Gait Abnormal Patient value  Initiation of gait    Step length-left    Step length-right    Step height-left x Drags heels  Step height-right x Drags heels  Step symmetry    Step continuity    Path deviation    Trunk movement x stiff  Walking stance x Slightly bent over      Annual Wellness Visit Requirements Recorded Today In  Medical, family, social history Past Medical, Family, Social History Section  Current providers Care team  Current medications Medications  Wt, BP, Ht, BMI Vital signs  Hearing assessment (welcome visit) Hearing/vision  Tobacco, alcohol, illicit drug use History  ADL Nurse Assessment    Depression Screening Nurse Assessment  Cognitive impairment Nurse Assessment  Mini Mental Status Document Flowsheet  Fall Risk Nurse Assessment  Home Safety Progress Note  End of Life Planning (welcome visit) Social Documentation  Medicare preventative services Progress Note  Risk factors/conditions needing evaluation/treatment Progress Note  Personalized health advice Patient Instructions, goals, letter  Diet & Exercise Social Documentation  Emergency Contact Social Documentation  Seat Belts Social Documentation  Sun exposure/protection Social Documentation    Prevention Plan:   Recommended Medicare Prevention Screenings Women over 65 Test For Frequency Date of Last- BOLD if needed  Breast Cancer 1-2 yrs 6/07  Cervical Cancer 1-3 yrs 5/06  Colorectal Cancer 1-10 yrs recommended  Osteoporosis once recommended  Cholesterol 5 yrs 7/11  Diabetes yearly 7/11  HIV yearly delcined  Influenza Shot yearly recommended  Pneumonia Shot once recommended  Zostavax Shot once recommended

## 2011-08-25 NOTE — Patient Instructions (Signed)
1. Schedule eye exam with [facility]. 2. Schedule dental cleaning at [facility] clinic. 3. Consider getting colonoscopy. 4. Consider getting mammogram. 5. Consider getting bone dentist screening. 6. Consider contacting Spero Geralds for counseling. 7. Try to move more a little day for exercise and contacting [employer] for [employer] Program. 8. Focus on eating 4-6 fruits and vegetables a day.

## 2011-08-28 ENCOUNTER — Encounter: Payer: Self-pay | Admitting: Home Health Services

## 2011-08-28 NOTE — Progress Notes (Signed)
I have reviewed note and agree with Bary Leriche and agree with her documentation.

## 2011-09-15 ENCOUNTER — Ambulatory Visit: Payer: Medicare Other | Admitting: Family Medicine

## 2011-09-30 ENCOUNTER — Ambulatory Visit (INDEPENDENT_AMBULATORY_CARE_PROVIDER_SITE_OTHER): Payer: Medicare Other | Admitting: Family Medicine

## 2011-09-30 ENCOUNTER — Other Ambulatory Visit: Payer: Self-pay | Admitting: Family Medicine

## 2011-09-30 ENCOUNTER — Encounter: Payer: Self-pay | Admitting: Family Medicine

## 2011-09-30 VITALS — BP 135/76 | HR 85 | Temp 98.3°F | Ht 63.0 in | Wt 256.0 lb

## 2011-09-30 DIAGNOSIS — E039 Hypothyroidism, unspecified: Secondary | ICD-10-CM

## 2011-09-30 DIAGNOSIS — Z1231 Encounter for screening mammogram for malignant neoplasm of breast: Secondary | ICD-10-CM

## 2011-09-30 DIAGNOSIS — F329 Major depressive disorder, single episode, unspecified: Secondary | ICD-10-CM

## 2011-09-30 DIAGNOSIS — R7309 Other abnormal glucose: Secondary | ICD-10-CM

## 2011-09-30 DIAGNOSIS — R739 Hyperglycemia, unspecified: Secondary | ICD-10-CM | POA: Insufficient documentation

## 2011-09-30 DIAGNOSIS — R079 Chest pain, unspecified: Secondary | ICD-10-CM

## 2011-09-30 DIAGNOSIS — E669 Obesity, unspecified: Secondary | ICD-10-CM

## 2011-09-30 DIAGNOSIS — K219 Gastro-esophageal reflux disease without esophagitis: Secondary | ICD-10-CM

## 2011-09-30 DIAGNOSIS — I10 Essential (primary) hypertension: Secondary | ICD-10-CM

## 2011-09-30 LAB — TSH: TSH: 1.299 u[IU]/mL (ref 0.350–4.500)

## 2011-09-30 LAB — COMPREHENSIVE METABOLIC PANEL
AST: 15 U/L (ref 0–37)
BUN: 16 mg/dL (ref 6–23)
Calcium: 9.9 mg/dL (ref 8.4–10.5)
Chloride: 102 mEq/L (ref 96–112)
Creat: 1.27 mg/dL — ABNORMAL HIGH (ref 0.50–1.10)

## 2011-09-30 LAB — CBC
HCT: 34.3 % — ABNORMAL LOW (ref 36.0–46.0)
MCV: 83.5 fL (ref 78.0–100.0)
RDW: 13.4 % (ref 11.5–15.5)
WBC: 9.4 10*3/uL (ref 4.0–10.5)

## 2011-09-30 LAB — POCT GLYCOSYLATED HEMOGLOBIN (HGB A1C): Hemoglobin A1C: 5.6

## 2011-09-30 MED ORDER — OMEPRAZOLE 20 MG PO CPDR: 40.0000 mg | DELAYED_RELEASE_CAPSULE | Freq: Every day | ORAL | Status: DC

## 2011-09-30 NOTE — Progress Notes (Signed)
  Subjective:    Patient ID: Gwendolyn Hicks, female    DOB: 02/21/1956, 56 y.o.   MRN: [id number]  HPI  1. GERD. Patient has chronic history of GERD. She states she has been taking Zantac 150 mg daily however this is not helping her "gastritis" symptoms. Her heartburn has improved on this regimen, however she still feels some intermittent right upper quadrant, epigastric, and left upper quadrant pains. The pains start after meals especially when she eats greens. This is not present currently. Notably she takes an enteric-coated aspirin daily. She denies any NSAID use, caffeine, nicotine. Denied any emesis or diarrhea. She does note constipation chronically. She also feels that she has to burp. Intermittently takes Mylanta.  2. History atypical chest pain. Patient denies any chest pain recently. She reports a normal cardiac stress test several years ago. I see a dictated consultation note by Dr. Jens Som of our cardiology in May 2009. Patient questions whether or not she needs some sort of vein study because the office continues to send her medications.  3. hypertension. Patient has been taking medications as prescribed. Does not check blood pressure at home. She denies any chest pain, edema, shortness of breath, weight changes.  Review of Systems See HPI otherwise negative. Nonsmoker.    Objective:   Physical Exam  Vitals reviewed. Constitutional: She is oriented to person, place, and time. She appears well-developed and well-nourished. No distress.  HENT:  Head: Normocephalic and atraumatic.  Mouth/Throat: Oropharynx is clear and moist.  Eyes: EOM are normal.  Neck: Neck supple.  Cardiovascular: Normal rate, regular rhythm, normal heart sounds and intact distal pulses.   No murmur heard. Pulmonary/Chest: Effort normal and breath sounds normal. No respiratory distress. She has no wheezes. She has no rales.  Abdominal: Bowel sounds are normal. She exhibits no distension. There is no  tenderness. There is no rebound and no guarding.       Obese  Musculoskeletal: She exhibits no edema and no tenderness.  Neurological: She is alert and oriented to person, place, and time. Coordination normal.  Skin: No rash noted.       Assessment & Plan:

## 2011-09-30 NOTE — Patient Instructions (Signed)
Will call about test results. May start prilosec for reflux symptoms. Make an appointment in one month if still having symptoms. Get your colonoscopy done as soon as possible. OK to use tums or mylanta as needed.    Diet for GERD or PUD Nutrition therapy can help ease the discomfort of gastroesophageal reflux disease (GERD) and peptic ulcer disease (PUD).  HOME CARE INSTRUCTIONS   Eat your meals slowly, in a relaxed setting.   Eat 5 to 6 small meals per day.   If a food causes distress, stop eating it for a period of time.  FOODS TO AVOID  Coffee, regular or decaffeinated.   Cola beverages, regular or low calorie.   Tea, regular or decaffeinated.   Pepper.   Cocoa.   High fat foods, including meats.   Butter, margarine, hydrogenated oil (trans fats).   Peppermint or spearmint (if you have GERD).   Fruits and vegetables if not tolerated.   Alcohol.   Nicotine (smoking or chewing). This is one of the most potent stimulants to acid production in the gastrointestinal tract.   Any food that seems to aggravate your condition.  If you have questions regarding your diet, ask your caregiver or a registered dietitian. TIPS  Lying flat may make symptoms worse. Keep the head of your bed raised 6 to 9 inches (15 to 23 cm) by using a foam wedge or blocks under the legs of the bed.   Do not lay down until 3 hours after eating a meal.   Daily physical activity may help reduce symptoms.  MAKE SURE YOU:   Understand these instructions.   Will watch your condition.   Will get help right away if you are not doing well or get worse.  Document Released: 08/25/2005 Document Revised: 05/07/2011 Document Reviewed: 01/08/2009 [facility] Patient Information 2012 [place], [place].

## 2011-10-01 ENCOUNTER — Encounter: Payer: Self-pay | Admitting: Family Medicine

## 2011-10-01 NOTE — Assessment & Plan Note (Signed)
At goal today. Will check labs today has been over one year.

## 2011-10-01 NOTE — Assessment & Plan Note (Signed)
Last TSH in system is elevated. Will repeat on current dose of synthoid .

## 2011-10-01 NOTE — Assessment & Plan Note (Signed)
Current symptoms most likely related to GERD/gastritis with intermittent abdominal pain related to food intake. No current pain, blood in stool, weight loss, emesis to suggest more sinister pathology. Check h pylori and CBC to evaluate for possibility of ulcer. Patient seems unsure of medication history, but will give trial of prilosec 40mg  daily and dietary modifications. Patient has already been reminded, but we discussed importance of colonoscopy to screen for cancer in setting of ongoing GI symptoms. She agrees to schedule this ASAP. Follow up in one month if symptoms persist.

## 2011-10-01 NOTE — Assessment & Plan Note (Addendum)
No pain recently and negative stress test per patient. Asked patient to bring in the card she received regarding ?vein testing since I am uncertain of this. Also will have a records request for Myoview results in 2009. We discussed risks v benefits of daily low dose aspirin and patient feels her family cardiac hx warrants primary prevention though may be contributing to her GI symptoms.

## 2011-10-01 NOTE — Assessment & Plan Note (Signed)
Stable on prozac. Will continue for now per patient request.

## 2011-10-02 ENCOUNTER — Telehealth: Payer: Self-pay | Admitting: *Deleted

## 2011-10-02 NOTE — Telephone Encounter (Signed)
Message copied by Farrell Ours on Thu Oct 02, 2011  1:48 PM ------      Message from: Durwin Reges      Created: Wed Oct 01, 2011  2:04 PM      Regarding: normal lab results       Please call patient to say "her labs were all normal. Come back in one month if not improved. Get colonoscopy." Thanks!!

## 2011-10-02 NOTE — Telephone Encounter (Signed)
Spoke with patient and informed her of below 

## 2011-10-07 ENCOUNTER — Telehealth: Payer: Self-pay | Admitting: *Deleted

## 2011-10-07 NOTE — Telephone Encounter (Signed)
LVM for patient to call back. The results she requested are by Dr. Jens Som @ [facility]. She can call them to request them records/results. [phone]

## 2011-10-07 NOTE — Telephone Encounter (Signed)
Spoke with patient. The call she placed was to inform us that [facility] keeps sending her a letter every year since the stress test she had that she is needing to go to a vein and Vascular specialist. I informed her that I would let PCP know, but being that it is [place] sending this letter that she needs to follow up with them. She agreed and was fine with this. ---------Huntley Dec

## 2011-10-07 NOTE — Telephone Encounter (Signed)
Message copied by Farrell Ours on Tue Oct 07, 2011 12:12 PM ------      Message from: Lorita Officer      Created: Tue Oct 07, 2011 11:56 AM      Regarding: FW: [place] cards records                   ----- Message -----         From: Lloyd Huger, MD         Sent: 10/01/2011   1:39 PM           To: [facility] Admin      Subject: [place] cards records                                    Can we request record regarding myoview stress test results from 2009? She saw Dr. Jens Som. Thanks

## 2011-10-15 ENCOUNTER — Other Ambulatory Visit: Payer: Self-pay | Admitting: Family Medicine

## 2011-10-15 ENCOUNTER — Ambulatory Visit
Admission: RE | Admit: 2011-10-15 | Discharge: 2011-10-15 | Disposition: A | Discharge status: Home / Self Care | Payer: Medicare Other | Source: Ambulatory Visit | Attending: Family Medicine | Admitting: Family Medicine

## 2011-10-15 DIAGNOSIS — Z1231 Encounter for screening mammogram for malignant neoplasm of breast: Secondary | ICD-10-CM

## 2011-10-15 DIAGNOSIS — Z780 Asymptomatic menopausal state: Secondary | ICD-10-CM

## 2011-10-20 ENCOUNTER — Other Ambulatory Visit: Payer: Medicare Other

## 2011-10-22 ENCOUNTER — Other Ambulatory Visit: Payer: Self-pay | Admitting: Family Medicine

## 2011-10-22 DIAGNOSIS — R928 Other abnormal and inconclusive findings on diagnostic imaging of breast: Secondary | ICD-10-CM

## 2011-10-31 ENCOUNTER — Inpatient Hospital Stay: Admission: RE | Admit: 2011-10-31 | Payer: Medicare Other | Source: Ambulatory Visit

## 2011-10-31 ENCOUNTER — Other Ambulatory Visit: Payer: Medicare Other

## 2011-11-19 ENCOUNTER — Other Ambulatory Visit: Payer: Self-pay | Admitting: Family Medicine

## 2011-11-19 ENCOUNTER — Ambulatory Visit
Admission: RE | Admit: 2011-11-19 | Discharge: 2011-11-19 | Disposition: A | Discharge status: Home / Self Care | Payer: Medicare Other | Source: Ambulatory Visit | Attending: Family Medicine | Admitting: Family Medicine

## 2011-11-19 DIAGNOSIS — R928 Other abnormal and inconclusive findings on diagnostic imaging of breast: Secondary | ICD-10-CM

## 2011-11-19 DIAGNOSIS — Z780 Asymptomatic menopausal state: Secondary | ICD-10-CM

## 2011-12-06 ENCOUNTER — Encounter (HOSPITAL_COMMUNITY): Payer: Self-pay

## 2011-12-06 ENCOUNTER — Telehealth: Payer: Self-pay | Admitting: Family Medicine

## 2011-12-06 ENCOUNTER — Emergency Department (INDEPENDENT_AMBULATORY_CARE_PROVIDER_SITE_OTHER)
Admission: EM | Admit: 2011-12-06 | Discharge: 2011-12-06 | Disposition: A | Discharge status: Home / Self Care | Payer: Medicare Other | Source: Home / Self Care | Attending: Family Medicine | Admitting: Family Medicine

## 2011-12-06 DIAGNOSIS — J309 Allergic rhinitis, unspecified: Secondary | ICD-10-CM

## 2011-12-06 DIAGNOSIS — J302 Other seasonal allergic rhinitis: Secondary | ICD-10-CM

## 2011-12-06 MED ORDER — METHYLPREDNISOLONE ACETATE 40 MG/ML IJ SUSP
80.0000 mg | Freq: Once | INTRAMUSCULAR | Status: AC
  Administered 2011-12-06: 80 mg via INTRAMUSCULAR

## 2011-12-06 MED ORDER — METHYLPREDNISOLONE ACETATE 80 MG/ML IJ SUSP
INTRAMUSCULAR | Status: AC
  Filled 2011-12-06: qty 1

## 2011-12-06 MED ORDER — CETIRIZINE HCL 10 MG PO TABS: 10.0000 mg | ORAL_TABLET | Freq: Every day | ORAL | Status: DC

## 2011-12-06 MED ORDER — FLUTICASONE PROPIONATE 50 MCG/ACT NA SUSP: 2.0000 | Freq: Every day | NASAL | Status: DC

## 2011-12-06 NOTE — ED Provider Notes (Signed)
History     CSN: [id number]  Arrival date & time 12/06/11  1341   First MD Initiated Contact with Patient 12/06/11 1453      Chief Complaint  Patient presents with  . Sinusitis    (Consider location/radiation/quality/duration/timing/severity/associated sxs/prior treatment) Patient is a 56 y.o. female presenting with URI. The history is provided by the patient.  URI The primary symptoms include sore throat and cough. Primary symptoms do not include fever, wheezing, abdominal pain, nausea or vomiting. The current episode started 3 to 5 days ago. This is a new problem. The problem has not changed since onset. Symptoms associated with the illness include sinus pressure, congestion and rhinorrhea.    Past Medical History  Diagnosis Date  . Hypertension   . Depression   . Anemia     Past Surgical History  Procedure Date  . Cesarean section     2    Family History  Problem Relation Age of Onset  . Hypertension Mother   . Heart disease Mother   . Hypertension Father   . Heart disease Father     History  Substance Use Topics  . Smoking status: Never Smoker   . Smokeless tobacco: Never Used  . Alcohol Use: No    OB History    Grav Para Term Preterm Abortions TAB SAB Ect Mult Living                  Review of Systems  Constitutional: Negative for fever.  HENT: Positive for congestion, sore throat, rhinorrhea and sinus pressure.   Respiratory: Positive for cough. Negative for wheezing.   Gastrointestinal: Negative for nausea, vomiting and abdominal pain.    Allergies  Diphenhydramine hcl  Home Medications   Current Outpatient Rx  Name Route Sig Dispense Refill  . ASPIRIN 81 MG PO TBEC Oral Take 81 mg by mouth daily.      Marland Kitchen CARVEDILOL 3.125 MG PO TABS Oral Take 1 tablet (3.125 mg total) by mouth 2 (two) times daily. 60 tablet 11  . CETIRIZINE HCL 10 MG PO TABS Oral Take 1 tablet (10 mg total) by mouth daily. One tab daily for allergies 30 tablet 1  .  FLUOXETINE HCL 20 MG PO CAPS Oral Take 1 capsule (20 mg total) by mouth daily. 30 capsule 5  . FLUTICASONE PROPIONATE 50 MCG/ACT NA SUSP Nasal Place 2 sprays into the nose daily. 1 g 2  . LEVOTHYROXINE SODIUM 125 MCG PO TABS Oral Take 1 tablet (125 mcg total) by mouth daily. 90 tablet 3  . LISINOPRIL-HYDROCHLOROTHIAZIDE 20-12.5 MG PO TABS Oral Take 2 tablets by mouth daily. 60 tablet 11  . OMEPRAZOLE 20 MG PO CPDR Oral Take 2 capsules (40 mg total) by mouth daily. 90 capsule 3  . RANITIDINE HCL 150 MG PO TABS Oral Take 150 mg by mouth 2 (two) times daily.      BP 150/86  Pulse 94  Temp(Src) 100 F (37.8 C) (Oral)  Resp 20  SpO2 98%  Physical Exam  Nursing note and vitals reviewed. Constitutional: She is oriented to person, place, and time. She appears well-developed and well-nourished.  HENT:  Head: Normocephalic.  Right Ear: External ear normal.  Left Ear: External ear normal.  Nose: Mucosal edema and rhinorrhea present.  Mouth/Throat: Oropharynx is clear and moist.  Neck: Normal range of motion. Neck supple.  Cardiovascular: Normal rate, regular rhythm, normal heart sounds and intact distal pulses.   Pulmonary/Chest: Breath sounds normal.  Neurological: She is  alert and oriented to person, place, and time.  Skin: Skin is warm and dry.  Psychiatric: She has a normal mood and affect.    ED Course  Procedures (including critical care time)  Labs Reviewed - No data to display No results found.   1. Seasonal allergic rhinitis       MDM          Linna Hoff, MD 12/06/11 [phone]

## 2011-12-06 NOTE — ED Notes (Signed)
Pt has sinus congestion and pain since Wednesday.

## 2011-12-06 NOTE — Telephone Encounter (Signed)
Received call from patient that she wanted something called in for head congestion.  Told her that it was not our policy to call in medications over the phone.  I recommended she could try OTC medications for this, specifically coricidin hbp or similar since she does have hypertension.  Told her she could ask pharmacist for other generics that may be able to help.

## 2011-12-26 ENCOUNTER — Encounter: Payer: Self-pay | Admitting: Family Medicine

## 2012-04-07 ENCOUNTER — Other Ambulatory Visit: Payer: Self-pay | Admitting: *Deleted

## 2012-04-07 DIAGNOSIS — I10 Essential (primary) hypertension: Secondary | ICD-10-CM

## 2012-04-08 MED ORDER — LISINOPRIL-HYDROCHLOROTHIAZIDE 20-12.5 MG PO TABS: 2.0000 | ORAL_TABLET | Freq: Every day | ORAL | Status: DC

## 2012-04-15 ENCOUNTER — Other Ambulatory Visit: Payer: Self-pay | Admitting: *Deleted

## 2012-04-15 DIAGNOSIS — I10 Essential (primary) hypertension: Secondary | ICD-10-CM

## 2012-04-16 MED ORDER — LISINOPRIL-HYDROCHLOROTHIAZIDE 20-12.5 MG PO TABS: 2.0000 | ORAL_TABLET | Freq: Every day | ORAL | Status: DC

## 2012-04-19 ENCOUNTER — Other Ambulatory Visit: Payer: Self-pay | Admitting: *Deleted

## 2012-04-19 DIAGNOSIS — I10 Essential (primary) hypertension: Secondary | ICD-10-CM

## 2012-04-19 DIAGNOSIS — E039 Hypothyroidism, unspecified: Secondary | ICD-10-CM

## 2012-04-19 MED ORDER — CARVEDILOL 3.125 MG PO TABS: 3.1250 mg | ORAL_TABLET | Freq: Two times a day (BID) | ORAL | Status: DC

## 2012-04-19 MED ORDER — LEVOTHYROXINE SODIUM 125 MCG PO TABS: 125.0000 ug | ORAL_TABLET | Freq: Every day | ORAL | Status: DC

## 2012-05-26 ENCOUNTER — Encounter: Payer: Self-pay | Admitting: Family Medicine

## 2012-05-26 ENCOUNTER — Ambulatory Visit (INDEPENDENT_AMBULATORY_CARE_PROVIDER_SITE_OTHER): Payer: Medicare Other | Admitting: Family Medicine

## 2012-05-26 VITALS — BP 148/79 | HR 63 | Temp 99.3°F | Ht 63.0 in | Wt 241.7 lb

## 2012-05-26 DIAGNOSIS — E049 Nontoxic goiter, unspecified: Secondary | ICD-10-CM

## 2012-05-26 DIAGNOSIS — R739 Hyperglycemia, unspecified: Secondary | ICD-10-CM

## 2012-05-26 DIAGNOSIS — F329 Major depressive disorder, single episode, unspecified: Secondary | ICD-10-CM

## 2012-05-26 DIAGNOSIS — I10 Essential (primary) hypertension: Secondary | ICD-10-CM

## 2012-05-26 DIAGNOSIS — R7309 Other abnormal glucose: Secondary | ICD-10-CM

## 2012-05-26 DIAGNOSIS — E039 Hypothyroidism, unspecified: Secondary | ICD-10-CM

## 2012-05-26 DIAGNOSIS — E010 Iodine-deficiency related diffuse (endemic) goiter: Secondary | ICD-10-CM | POA: Insufficient documentation

## 2012-05-26 MED ORDER — OMEPRAZOLE 20 MG PO CPDR: 40.0000 mg | DELAYED_RELEASE_CAPSULE | Freq: Every day | ORAL | Status: DC

## 2012-05-26 MED ORDER — FLUOXETINE HCL 20 MG PO CAPS: 20.0000 mg | ORAL_CAPSULE | Freq: Every day | ORAL | Status: DC

## 2012-05-26 MED ORDER — LISINOPRIL-HYDROCHLOROTHIAZIDE 20-12.5 MG PO TABS: 2.0000 | ORAL_TABLET | Freq: Every day | ORAL | Status: DC

## 2012-05-26 NOTE — Patient Instructions (Addendum)
Nice to see you! Please come visit more often, about every 4 months. Make lab visit for blood work. Make appointment for pap smear in next one month, we will talk about thyroid test at this visit. Think about getting a colonoscopy. Your medications are sent to [facility], please call if unable to get them.

## 2012-05-27 NOTE — Assessment & Plan Note (Signed)
Refilled low dose prozac. F/u in 4 months.

## 2012-05-27 NOTE — Assessment & Plan Note (Signed)
Patient due for fasting labs, have been ordered. Try for improved HTN control and stressed f/u for recheck and restart medications including ACEi/HCTZ. If stable with f/u q 4 months.

## 2012-05-27 NOTE — Assessment & Plan Note (Signed)
Mildly above goal, but has not taken medications today. Medication refills are sent to pharmacy last month. Advised patient to pick these up and return in next week for follow up BP. Return for fasting labs.

## 2012-05-27 NOTE — Assessment & Plan Note (Signed)
Question nodularity on exam. Will check thyroid US to rule out concerns for malignancy. Check TSH.

## 2012-05-27 NOTE — Assessment & Plan Note (Signed)
F/u TSH. Unsure if 15 lb weight loss is significant.

## 2012-05-27 NOTE — Assessment & Plan Note (Signed)
F/u fasting CBG. Lab Results  Component Value Date   HGBA1C 5.6 09/30/2011

## 2012-05-27 NOTE — Progress Notes (Signed)
  Subjective:    Patient ID: Gwendolyn Hicks, female    DOB: 12-11-1955, 56 y.o.   MRN: [id number]  HPI  1. HTN. States she ran out of medications yesterday, she only comes in because pharmacy notified her she needed office visit prior to refills. Does not check at home. I received letter from medication company stating she maybe noncompliant with coreg because it hasn't been filled recently. She denies chest pain, edema, dyspnea, visual changes.  2. Hypothyroid. States compliance with synthroid. Denies any weight changes or fatigue, changes in stool. On review of records she has lost 15 lbs in past 8 months. Wt Readings from Last 3 Encounters:  05/26/12 241 lb 11.2 oz (109.634 kg)  09/30/11 256 lb (116.121 kg)  08/25/11 256 lb 4.8 oz (116.257 kg)   3. HM. She is behind on pap. Had f/u to asymmetry on mammogram in March, this was normal birads 1. Refuses colon cancer screening including FOB and colonoscopy referral today.   4. Depression. Endorses compliance with prozac but does not bring medications. Says things are well at home. Denies any depression, mood problems.   Review of Systems See HPI otherwise negative.  reports that she has never smoked. She has never used smokeless tobacco.     Objective:   Physical Exam  Vitals reviewed. Constitutional: She is oriented to person, place, and time. She appears well-developed and well-nourished. No distress.  HENT:  Head: Normocephalic and atraumatic.  Mouth/Throat: Oropharynx is clear and moist.  Eyes: EOM are normal. Pupils are equal, round, and reactive to light.  Neck: Normal range of motion. Thyromegaly present.       R>L thyromegaly, questionable nodularity on right, no distinct masses palpated. nontender  Cardiovascular: Normal rate, regular rhythm and normal heart sounds.   No murmur heard. Pulmonary/Chest: Effort normal and breath sounds normal. No respiratory distress. She has no wheezes. She has no rales.  Abdominal: Soft.    Musculoskeletal: She exhibits no edema and no tenderness.  Lymphadenopathy:    She has no cervical adenopathy.  Neurological: She is alert and oriented to person, place, and time. No cranial nerve deficit.  Skin: No rash noted. She is not diaphoretic.  Psychiatric: She has a normal mood and affect.          Assessment & Plan:

## 2012-05-28 ENCOUNTER — Telehealth: Payer: Self-pay | Admitting: *Deleted

## 2012-05-28 NOTE — Telephone Encounter (Signed)
Pt has appt and was notified .Gwendolyn Hicks

## 2012-05-28 NOTE — Telephone Encounter (Signed)
Message copied by Deno Etienne on Fri May 28, 2012 11:58 AM ------      Message from: Durwin Reges      Created: Thu May 27, 2012  1:48 PM      Regarding: needs nurse visit for BP 9/26       Needs nurse visit to check BP when she comes for fasting labs on 9/26. Please make this appointment.

## 2012-06-01 ENCOUNTER — Ambulatory Visit (HOSPITAL_COMMUNITY)
Admission: RE | Admit: 2012-06-01 | Discharge: 2012-06-01 | Disposition: A | Discharge status: Home / Self Care | Payer: Medicare Other | Source: Ambulatory Visit | Attending: Family Medicine | Admitting: Family Medicine

## 2012-06-01 DIAGNOSIS — E010 Iodine-deficiency related diffuse (endemic) goiter: Secondary | ICD-10-CM

## 2012-06-01 DIAGNOSIS — E049 Nontoxic goiter, unspecified: Secondary | ICD-10-CM | POA: Insufficient documentation

## 2012-06-03 ENCOUNTER — Other Ambulatory Visit: Payer: Medicare Other

## 2012-06-03 DIAGNOSIS — E039 Hypothyroidism, unspecified: Secondary | ICD-10-CM

## 2012-06-03 DIAGNOSIS — I10 Essential (primary) hypertension: Secondary | ICD-10-CM

## 2012-06-03 LAB — LIPID PANEL
Cholesterol: 149 mg/dL (ref 0–200)
HDL: 49 mg/dL (ref >39)
Triglycerides: 60 mg/dL (ref <150)

## 2012-06-03 LAB — CBC WITH DIFFERENTIAL/PLATELET
Basophils Relative: 1 % (ref 0–1)
Eosinophils Absolute: 0.1 10*3/uL (ref 0.0–0.7)
Eosinophils Relative: 1 % (ref 0–5)
HCT: 32.7 % — ABNORMAL LOW (ref 36.0–46.0)
Hemoglobin: 11.1 g/dL — ABNORMAL LOW (ref 12.0–15.0)
MCH: 27.3 pg (ref 26.0–34.0)
MCHC: 33.9 g/dL (ref 30.0–36.0)
MCV: 80.5 fL (ref 78.0–100.0)
Monocytes Absolute: 0.5 10*3/uL (ref 0.1–1.0)
Monocytes Relative: 7 % (ref 3–12)

## 2012-06-03 LAB — COMPREHENSIVE METABOLIC PANEL
Albumin: 4.3 g/dL (ref 3.5–5.2)
Alkaline Phosphatase: 73 U/L (ref 39–117)
BUN: 17 mg/dL (ref 6–23)
Creat: 1.27 mg/dL — ABNORMAL HIGH (ref 0.50–1.10)
Glucose, Bld: 108 mg/dL — ABNORMAL HIGH (ref 70–99)
Potassium: 3.8 mEq/L (ref 3.5–5.3)
Total Bilirubin: 0.5 mg/dL (ref 0.3–1.2)

## 2012-06-03 NOTE — Progress Notes (Signed)
Flp,cmp,cbc with diff and tsh done today Lemario Chaikin

## 2012-06-04 ENCOUNTER — Telehealth: Payer: Self-pay | Admitting: Family Medicine

## 2012-06-04 DIAGNOSIS — D649 Anemia, unspecified: Secondary | ICD-10-CM

## 2012-06-04 DIAGNOSIS — E041 Nontoxic single thyroid nodule: Secondary | ICD-10-CM

## 2012-06-04 NOTE — Telephone Encounter (Signed)
Hemoccult cards given and explained to patient.  Gaylene Brooks, RN

## 2012-06-04 NOTE — Telephone Encounter (Signed)
Discussed with patient results;  Anemia-again recommend patient have colonoscopy. She defers this, agrees to come pick up stool cards for now.  Thyroid nodules-solid 1.7x1.9cm nodule on left. Normal TSH. Recommend she have an FNA. Agrees to discuss this with surgeon. Will make referral.

## 2012-06-07 ENCOUNTER — Ambulatory Visit (INDEPENDENT_AMBULATORY_CARE_PROVIDER_SITE_OTHER): Payer: Medicare Other | Admitting: General Surgery

## 2012-06-07 ENCOUNTER — Encounter (INDEPENDENT_AMBULATORY_CARE_PROVIDER_SITE_OTHER): Payer: Self-pay | Admitting: General Surgery

## 2012-06-07 VITALS — BP 118/80 | HR 64 | Temp 96.8°F | Resp 20 | Ht 64.0 in | Wt 240.0 lb

## 2012-06-07 DIAGNOSIS — E041 Nontoxic single thyroid nodule: Secondary | ICD-10-CM

## 2012-06-07 NOTE — Progress Notes (Signed)
Patient ID: Gwendolyn Hicks, female   DOB: [birth year], 56 y.o.   MRN: [id number]  Chief Complaint  Patient presents with  . Thyroid Nodule    HPI Gwendolyn Hicks is a 56 y.o. female.   HPI 56 yo AAF referred by Dr Cristal Ford for evaluation of a thyroid nodule. During her most recent office visit, it was noted that the patient has lost some weight over the past several months and there was questionable nodularity on physical exam of the patient's thyroid which prompted an neck ultrasound. This ultrasound showed multinodular thyroid with a dominant Left lobe nodule around 1.7 x 1.9 x 0.9cm without any calcifications.   The patient states that she has been on a thyroid pill for at least 10 years. She denies any diarrhea or constipation. She states that she is compliant with her medication. She sometimes will have a bowel movement every other day. She denies any difficulty or trouble swallowing. She denies any shortness of breath. She denies any voice changes. She denies any history of radiation to her chest or neck. She denies any family history of any type of malignancy. She states that she has 2 aunts had thyroid problems. She denies any palpitations. She does have some anxiety. She will occasionally have some swelling in her feet. This is the first ultrasound of her neck. She states that she has lost some weight; however, she states her weight does fluctuate. Past Medical History  Diagnosis Date  . Hypertension   . Depression   . Anemia     Past Surgical History  Procedure Date  . Cesarean section     2    Family History  Problem Relation Age of Onset  . Hypertension Mother   . Heart disease Mother   . Hypertension Father   . Heart disease Father     Social History History  Substance Use Topics  . Smoking status: Never Smoker   . Smokeless tobacco: Never Used  . Alcohol Use: No    Allergies  Allergen Reactions  . Diphenhydramine Hcl     Current Outpatient Prescriptions    Medication Sig Dispense Refill  . aspirin (ANACIN) 81 MG EC tablet Take 81 mg by mouth daily.        . carvedilol (COREG) 3.125 MG tablet Take 1 tablet (3.125 mg total) by mouth 2 (two) times daily.  60 tablet  11  . cetirizine (ZYRTEC) 10 MG tablet Take 1 tablet (10 mg total) by mouth daily. One tab daily for allergies  30 tablet  1  . FLUoxetine (PROZAC) 20 MG capsule Take 1 capsule (20 mg total) by mouth daily.  30 capsule  5  . levothyroxine (SYNTHROID, LEVOTHROID) 125 MCG tablet Take 1 tablet (125 mcg total) by mouth daily.  90 tablet  3  . lisinopril-hydrochlorothiazide (PRINZIDE,ZESTORETIC) 20-12.5 MG per tablet Take 2 tablets by mouth daily.  60 tablet  5  . omeprazole (PRILOSEC) 20 MG capsule Take 2 capsules (40 mg total) by mouth daily.  90 capsule  3    Review of Systems Review of Systems  Constitutional: Negative for fever, activity change, appetite change, fatigue and unexpected weight change.  HENT: Negative for hearing loss, nosebleeds, neck pain and neck stiffness.   Eyes: Negative for photophobia and visual disturbance.  Respiratory: Negative for cough, choking, chest tightness, shortness of breath and wheezing.   Cardiovascular: Positive for leg swelling (some occasional ankle swelling). Negative for chest pain and palpitations.  Gastrointestinal: Negative for nausea,  abdominal pain and diarrhea.  Genitourinary: Negative for dysuria and difficulty urinating.  Musculoskeletal: Negative for back pain and gait problem.  Skin: Negative for pallor and rash.  Neurological: Negative for tremors, seizures, syncope, numbness and headaches.       Denies TIA, amaurosis fugax  Hematological: Negative for adenopathy. Does not bruise/bleed easily.  Psychiatric/Behavioral: Negative for confusion.    Blood pressure 118/80, pulse 64, temperature 96.8 F (36 C), temperature source Temporal, resp. rate 20, height 5\' 4"  (1.626 m), weight 240 lb (108.863 kg).  Physical Exam Physical  Exam  Vitals reviewed. Constitutional: She is oriented to person, place, and time. She appears well-developed and well-nourished. No distress.  HENT:  Head: Normocephalic and atraumatic.  Right Ear: External ear normal.  Left Ear: External ear normal.  Mouth/Throat: No oropharyngeal exudate.  Eyes: Conjunctivae normal are normal. No scleral icterus.  Neck: Normal range of motion and phonation normal. Neck supple. No tracheal deviation present. No mass and no thyromegaly present.  Cardiovascular: Normal rate, regular rhythm and normal heart sounds.   Pulmonary/Chest: Effort normal and breath sounds normal. No stridor. No respiratory distress. She has no wheezes.  Abdominal: Soft. She exhibits no distension. There is no tenderness.  Musculoskeletal: She exhibits no edema and no tenderness.  Lymphadenopathy:    She has no cervical adenopathy.  Neurological: She is alert and oriented to person, place, and time.  Skin: Skin is warm and dry. No rash noted. She is not diaphoretic. No erythema.       Scar rt forearm  Psychiatric: She has a normal mood and affect. Her behavior is normal. Judgment and thought content normal.    Data Reviewed Dr Ernest Haber note from 05/2012 Labs from 05/2012 - hgb 11, hct 32.7; TSH 3.54 normal; Cr 1.27  THYROID ULTRASOUND  Technique: Ultrasound examination of the thyroid gland and adjacent  soft tissues was performed.  Comparison: None.  Findings:  Right thyroid lobe: 3.8 x 1.4 x 1.5 cm  Left thyroid lobe: 4.9 x 1.9 x 1.4 cm  Isthmus: 8 mm.  Focal nodules: Thyroid echotexture is diffusely markedly  heterogeneous.  7 mm lower pole right-sided mildly hyperechoic solid nodule is  identified image 10. Immediately adjacent lower pole hyperechoic 6  mm nodule on the image 12.   The only well-defined left sided lesion is a mildly hyperechoic  solid one involving the lower pole left lobe. This measures 1.9 x  0.9 x 1.7 cm, including on image 29.  No lesions  demonstrate calcifications.  Lymphadenopathy: None visualized.   IMPRESSION:  1. Enlarged thyroid with diffuse heterogeneous echotexture. Most  consistent with multinodular goiter.  2. Bilateral solid thyroid nodules. The largest nodule, in the  lower pole left lobe, should be considered for tissue sampling.  This recommendation follows the consensus statement: Management of  Thyroid Nodules Detected as [place]: Society of Radiologists in  Ultrasound Consensus Conference Statement. Radiology 2005; 237:794-  800.    Assessment    Multinodular thyroid Left thyroid nodule    Plan    She appears to be euthyroid. We discussed thyroid gland and thyroid nodules. She was given Agricultural engineer. We discussed her ultrasound results. Based on the size of her large left thyroid nodule, I have recommended an ultrasound-guided FNA biopsy of this nodule. We discussed the risk and benefits of FNA biopsy. We discussed potential biopsy results. There were no calcifications seen on ultrasound which is somewhat reassuring. The patient has requested  The biopsy be done at the  end of the month. I will base my followup on the results of her biopsy results. If the biopsy results are benign, then I will recommend a repeat ultrasound in 6 months to monitor this nodule. If the biopsy result is indeterminate or very suspicious we will bring her back in the office to discuss options  Adalie Sella. Andrey Campanile, MD, FACS General, Bariatric, & Minimally Invasive Surgery [facility] Surgery, [place]        [facility] M 06/07/2012, 3:02 PM

## 2012-06-07 NOTE — Patient Instructions (Addendum)
Your follow-up will be based on your biopsy results  Thyroid Diseases Your thyroid is a butterfly-shaped gland in your neck. It is located just above your collarbone. It is one of your endocrine glands, which make hormones. The thyroid helps set your metabolism. Metabolism is how your body gets energy from the foods you eat.  Millions of people have thyroid diseases. Women experience thyroid problems more often than men. In fact, overactive thyroid problems (hyperthyroidism) occur in 1% of all women. If you have a thyroid disease, your body may use energy more slowly or quickly than it should.  Thyroid problems also include an immune disease where your body reacts against your thyroid gland (called thyroiditis). A different problem involves lumps and bumps (called nodules) that develop in the gland. The nodules are usually, but not always, noncancerous. THE MOST COMMON THYROID PROBLEMS AND CAUSES ARE DISCUSSED BELOW There are many causes for thyroid problems. Treatment depends upon the exact diagnosis and includes trying to reset your body's metabolism to a normal rate. Hyperthyroidism Too much thyroid hormone from an overactive thyroid gland is called hyperthyroidism. In hyperthyroidism, the body's metabolism speeds up. One of the most frequent forms of hyperthyroidism is known as Graves' disease. Graves' disease tends to run in families. Although Graves' is thought to be caused by a problem with the immune system, the exact nature of the genetic problem is unknown. Hypothyroidism Too little thyroid hormone from an underactive thyroid gland is called hypothyroidism. In hypothyroidism, the body's metabolism is slowed. Several things can cause this condition. Most causes affect the thyroid gland directly and hurt its ability to make enough hormone.  Rarely, there may be a pituitary gland tumor (located near the base of the brain). The tumor can block the pituitary from producing thyroid-stimulating  hormone (TSH). Your body makes TSH to stimulate the thyroid to work properly. If the pituitary does not make enough TSH, the thyroid fails to make enough hormones needed for good health. Whether the problem is caused by thyroid conditions or by the pituitary gland, the result is that the thyroid is not making enough hormones. Hypothyroidism causes many physical and mental processes to become sluggish. The body consumes less oxygen and produces less body heat. Thyroid Nodules A thyroid nodule is a small swelling or lump in the thyroid gland. They are common. These nodules represent either a growth of thyroid tissue or a fluid-filled cyst. Both form a lump in the thyroid gland. Almost half of all people will have tiny thyroid nodules at some point in their lives. Typically, these are not noticeable until they become large and affect normal thyroid size. Larger nodules that are greater than a half inch across (about 1 centimeter) occur in about 5 percent of people. Although most nodules are not cancerous, people who have them should seek medical care to rule out cancer. Also, some thyroid nodules may produce too much thyroid hormone or become too large. Large nodules or a large gland can interfere with breathing or swallowing or may cause neck discomfort. Other problems Other thyroid problems include cancer and thyroiditis. Thyroiditis is a malfunction of the body's immune system. Normally, the immune system works to defend the body against infection and other problems. When the immune system is not working properly, it may mistakenly attack normal cells, tissues, and organs. Examples of autoimmune diseases are Hashimoto's thyroiditis (which causes low thyroid function) and Graves' disease (which causes excess thyroid function). SYMPTOMS  Symptoms vary greatly depending upon the exact type of  problem with the thyroid. Hyperthyroidism-is when your thyroid is too active and makes more thyroid hormone than your  body needs. The most common cause is Graves' Disease. Too much thyroid hormone can cause some or all of the following symptoms:  Anxiety.   Irritability.   Difficulty sleeping.   Fatigue.   A rapid or irregular heartbeat.   A fine tremor of your hands or fingers.   An increase in perspiration.   Sensitivity to heat.   Weight loss, despite normal food intake.   Brittle hair.   Enlargement of your thyroid gland (goiter).   Light menstrual periods.   Frequent bowel movements.  Graves' disease can specifically cause eye and skin problems. The skin problems involve reddening and swelling of the skin, often on your shins and on the top of your feet. Eye problems can include the following:  Excess tearing and sensation of grit or sand in either or both eyes.   Reddened or inflamed eyes.   Widening of the space between your eyelids.   Swelling of the lids and tissues around the eyes.   Light sensitivity.   Ulcers on the cornea.   Double vision.   Limited eye movements.   Blurred or reduced vision.  Hypothyroidism- is when your thyroid gland is not active enough. This is more common than hyperthyroidism. Symptoms can vary a lot depending of the severity of the hormone deficiency. Symptoms may develop over a long period of time and can include several of the following:  Fatigue.   Sluggishness.   Increased sensitivity to cold.   Constipation.   Pale, dry skin.   A puffy face.   Hoarse voice.   High blood cholesterol level.   Unexplained weight gain.   Muscle aches, tenderness and stiffness.   Pain, stiffness or swelling in your joints.   Muscle weakness.   Heavier than normal menstrual periods.   Brittle fingernails and hair.   Depression.  Thyroid Nodules - most do not cause signs or symptoms. Occasionally, some may become so large that you can feel or even see the swelling at the base of your neck. You may realize a lump or swelling is there when  you are shaving or putting on makeup. Men might become aware of a nodule when shirt collars suddenly feel too tight. Some nodules produce too much thyroid hormone. This can produce the same symptoms as hyperthyroidism (see above). Thyroid nodules are seldom cancerous. However, a nodule is more likely to be malignant (cancerous) if it:  Grows quickly or feels hard.   Causes you to become hoarse or to have trouble swallowing or breathing.   Causes enlarged lymph nodes under your jaw or in your neck.  DIAGNOSIS  Because there are so many possible thyroid conditions, your caregiver may ask for a number of tests. They will do this in order to narrow down the exact diagnosis. These tests can include:  Blood and antibody tests.   Special thyroid scans using small, safe amounts of radioactive iodine.   Ultrasound of the thyroid gland (particularly if there is a nodule or lump).   Biopsy. This is usually done with a special needle. A needle biopsy is a procedure to obtain a sample of cells from the thyroid. The tissue will be tested in a lab and examined under a microscope.  TREATMENT  Treatment depends on the exact diagnosis. Hyperthyroidism  Beta-blockers help relieve many of the symptoms.   Anti-thyroid medications prevent the thyroid from making excess  hormones.   Radioactive iodine treatment can destroy overactive thyroid cells. The iodine can permanently decrease the amount of hormone produced.   Surgery to remove the thyroid gland.   Treatments for eye problems that come from Graves' disease also include medications and special eye surgery, if felt to be appropriate.  Hypothyroidism Thyroid replacement with levothyroxine is the mainstay of treatment. Treatment with thyroid replacement is usually lifelong and will require monitoring and adjustment from time to time. Thyroid Nodules  Watchful waiting. If a small nodule causes no symptoms or signs of cancer on biopsy, then no treatment  may be chosen at first. Re-exam and re-checking blood tests would be the recommended follow-up.   Anti-thyroid medications or radioactive iodine treatment may be recommended if the nodules produce too much thyroid hormone (see Treatment for Hyperthyroidism above).   Alcohol ablation. Injections of small amounts of ethyl alcohol (ethanol) can cause a non-cancerous nodule to shrink in size.   Surgery (see Treatment for Hyperthyroidism above).  HOME CARE INSTRUCTIONS   Take medications as instructed.   Follow through on recommended testing.  SEEK MEDICAL CARE IF:   You feel that you are developing symptoms of Hyperthyroidism or Hypothyroidism as described above.   You develop a new lump/nodule in the neck/thyroid area that you had not noticed before.   You feel that you are having side effects from medicines prescribed.   You develop trouble breathing or swallowing.  SEEK IMMEDIATE MEDICAL CARE IF:   You develop a fever of 102 F (38.9 C) or higher.   You develop severe sweating.   You develop palpitations and/or rapid heart beat.   You develop shortness of breath.   You develop nausea and vomiting.   You develop extreme shakiness.   You develop agitation.   You develop lightheadedness or have a fainting episode.  Document Released: 06/22/2007 Document Revised: 08/14/2011 Document Reviewed: 06/22/2007 [facility] Patient Information 2012 [place], [place].

## 2012-06-24 ENCOUNTER — Telehealth (INDEPENDENT_AMBULATORY_CARE_PROVIDER_SITE_OTHER): Payer: Self-pay | Admitting: General Surgery

## 2012-06-24 NOTE — Telephone Encounter (Signed)
Pt called to ask if she could take some calcium for leg cramping/aching/stiffness/ Not sure if this is related to her thyroid, Dr. Fredonia Highland suggested she could try taking sum TUMS a couple times a day. Pt will try this/gy

## 2012-07-06 ENCOUNTER — Other Ambulatory Visit (HOSPITAL_COMMUNITY)
Admission: RE | Admit: 2012-07-06 | Discharge: 2012-07-06 | Disposition: A | Discharge status: Home / Self Care | Payer: Medicare Other | Source: Ambulatory Visit | Attending: Diagnostic Radiology | Admitting: Diagnostic Radiology

## 2012-07-06 ENCOUNTER — Ambulatory Visit
Admission: RE | Admit: 2012-07-06 | Discharge: 2012-07-06 | Disposition: A | Discharge status: Home / Self Care | Payer: 59 | Source: Ambulatory Visit | Attending: General Surgery | Admitting: General Surgery

## 2012-07-06 DIAGNOSIS — E049 Nontoxic goiter, unspecified: Secondary | ICD-10-CM | POA: Insufficient documentation

## 2012-07-06 DIAGNOSIS — E041 Nontoxic single thyroid nodule: Secondary | ICD-10-CM

## 2012-07-08 ENCOUNTER — Telehealth (INDEPENDENT_AMBULATORY_CARE_PROVIDER_SITE_OTHER): Payer: Self-pay | Admitting: General Surgery

## 2012-07-08 DIAGNOSIS — E049 Nontoxic goiter, unspecified: Secondary | ICD-10-CM

## 2012-07-08 NOTE — Telephone Encounter (Signed)
Message copied by Liliana Cline on Thu Jul 08, 2012 11:14 AM ------      Message from: Andrey Campanile, ERIC M      Created: Thu Jul 08, 2012 11:12 AM       pls call pt - thyroid biopsy was benign - no cancer; rec f/u with me in 6 months with repeat neck u/s prior to appt

## 2012-07-08 NOTE — Telephone Encounter (Signed)
Left message for patient to call back and ask for me 

## 2012-07-09 NOTE — Telephone Encounter (Signed)
Patient aware of results and follow up plan. I will call her to set up ultrasound when closer to 6 months. She will call in the mean time with any problems.

## 2012-08-19 ENCOUNTER — Other Ambulatory Visit: Payer: Self-pay | Admitting: Family Medicine

## 2012-11-08 ENCOUNTER — Telehealth (INDEPENDENT_AMBULATORY_CARE_PROVIDER_SITE_OTHER): Payer: Self-pay | Admitting: General Surgery

## 2012-11-08 NOTE — Telephone Encounter (Signed)
Spoke with patient she has [place] scheduled for 11/12/12 1pm  . She was also advised to contact office after u/s to make appt with Dr Andrey Campanile  For results

## 2012-11-12 ENCOUNTER — Other Ambulatory Visit: Payer: Medicare Other

## 2012-11-15 ENCOUNTER — Encounter: Payer: Self-pay | Admitting: Home Health Services

## 2012-11-23 ENCOUNTER — Telehealth: Payer: Self-pay | Admitting: Family Medicine

## 2012-11-23 NOTE — Telephone Encounter (Signed)
Spoke with patient and informed her that GI tried to contact her to reschedule and I gave her their number [phone]

## 2012-11-23 NOTE — Telephone Encounter (Signed)
Patient needs her appt with imaging rescheduled because on 3/7 the weather was bad.

## 2013-01-15 ENCOUNTER — Other Ambulatory Visit: Payer: Self-pay | Admitting: Family Medicine

## 2013-03-24 ENCOUNTER — Other Ambulatory Visit: Payer: Self-pay | Admitting: Family Medicine

## 2013-04-27 ENCOUNTER — Other Ambulatory Visit: Payer: Self-pay | Admitting: Family Medicine

## 2013-04-29 ENCOUNTER — Other Ambulatory Visit: Payer: Self-pay | Admitting: Family Medicine

## 2013-05-10 ENCOUNTER — Other Ambulatory Visit: Payer: Self-pay | Admitting: Family Medicine

## 2013-06-15 ENCOUNTER — Other Ambulatory Visit: Payer: Self-pay | Admitting: Family Medicine

## 2013-07-11 ENCOUNTER — Other Ambulatory Visit: Payer: Self-pay | Admitting: Family Medicine

## 2013-07-12 ENCOUNTER — Telehealth: Payer: Self-pay | Admitting: Family Medicine

## 2013-07-12 NOTE — Telephone Encounter (Signed)
Please make patient aware I refilled her scripts x1 today. Patient needs to make an appointment to be seen for future refills. It appears it has been over a year since she was seen and was suppose to have follow in 4 months. I am also concerned as she was suppose to have a thyroid US and do not see record of it being completed. She now has a new PCP and needs to come in for future refills. Thanks.

## 2013-07-12 NOTE — Telephone Encounter (Signed)
Got message that circuit is not working

## 2013-07-14 NOTE — Telephone Encounter (Signed)
Called once again circuit is busy. I am closing note if patient calls back please informe her of below

## 2013-08-18 ENCOUNTER — Encounter: Payer: Self-pay | Admitting: Home Health Services

## 2013-08-29 ENCOUNTER — Other Ambulatory Visit: Payer: Self-pay | Admitting: Family Medicine

## 2013-09-06 ENCOUNTER — Ambulatory Visit (INDEPENDENT_AMBULATORY_CARE_PROVIDER_SITE_OTHER): Payer: Medicare Other | Admitting: Family Medicine

## 2013-09-06 ENCOUNTER — Encounter: Payer: Self-pay | Admitting: Family Medicine

## 2013-09-06 VITALS — BP 140/76 | HR 89 | Temp 98.9°F | Ht 64.0 in | Wt 249.0 lb

## 2013-09-06 DIAGNOSIS — Z1239 Encounter for other screening for malignant neoplasm of breast: Secondary | ICD-10-CM | POA: Insufficient documentation

## 2013-09-06 DIAGNOSIS — R7309 Other abnormal glucose: Secondary | ICD-10-CM

## 2013-09-06 DIAGNOSIS — R739 Hyperglycemia, unspecified: Secondary | ICD-10-CM

## 2013-09-06 DIAGNOSIS — I10 Essential (primary) hypertension: Secondary | ICD-10-CM

## 2013-09-06 DIAGNOSIS — Z0000 Encounter for general adult medical examination without abnormal findings: Secondary | ICD-10-CM

## 2013-09-06 DIAGNOSIS — E039 Hypothyroidism, unspecified: Secondary | ICD-10-CM

## 2013-09-06 DIAGNOSIS — E041 Nontoxic single thyroid nodule: Secondary | ICD-10-CM

## 2013-09-06 LAB — CBC WITH DIFFERENTIAL/PLATELET
Hemoglobin: 11.2 g/dL — ABNORMAL LOW (ref 12.0–15.0)
Lymphocytes Relative: 30 % (ref 12–46)
Lymphs Abs: 2.8 10*3/uL (ref 0.7–4.0)
MCV: 82.3 fL (ref 78.0–100.0)
Neutrophils Relative %: 60 % (ref 43–77)
Platelets: 263 10*3/uL (ref 150–400)
RBC: 4.07 MIL/uL (ref 3.87–5.11)
WBC: 9.3 10*3/uL (ref 4.0–10.5)

## 2013-09-06 LAB — COMPLETE METABOLIC PANEL WITH GFR
ALT: 15 U/L (ref 0–35)
Albumin: 4.3 g/dL (ref 3.5–5.2)
CO2: 30 mEq/L (ref 19–32)
Calcium: 9.6 mg/dL (ref 8.4–10.5)
Chloride: 105 mEq/L (ref 96–112)
GFR, Est African American: 62 mL/min
Sodium: 142 mEq/L (ref 135–145)
Total Protein: 7.8 g/dL (ref 6.0–8.3)

## 2013-09-06 LAB — LDL CHOLESTEROL, DIRECT: Direct LDL: 101 mg/dL — ABNORMAL HIGH

## 2013-09-06 NOTE — Progress Notes (Signed)
   Subjective:    Patient ID: Gwendolyn Hicks, female    DOB: [birth year], 57 y.o.   MRN: [id number]  HPI  Patient is an established patient at this clinic she's newly assigned to myself. She has no complaints today.  Hypertension: Patient's blood pressure she is on 140/76. She states this is actually high for her and it tends to be this way whenever she comes to the doctor's office. She takes her blood pressure at home and  her blood pressure is about 120/76. She denies any headaches, chest pain, palpitations or shortness of breath. She states as long as she takes her medications she does not experience any leg edema.  Thyroid nodule: Per patient chart review she's had a thyroid nodule that was being worked up by endocrine. She states that her wrist doctor's name but he is across the street from this building. She states she's had an ultrasound in which she said that she just had a nodule, and that he would like to followup with with a repeat ultrasound in the future. She never went back to have a repeat ultrasound completed. She states she has been feeling fine and has had no complications that she is aware of.   Health maintenance: Patient's last Pap was in 2006 and was normal. She does not recall ever having an irregular Pap. She is postmenopausal. She has not had her colonoscopy but is willing to schedule. She is overdue for mammogram and will schedule at the beginning of the year. She declines the flu shot. She is overdue for routine lab work.   Review of Systems Negative, with the exception of above mentioned in HPI     Objective:   Physical Exam BP 140/76  Pulse 89  Temp(Src) 98.9 F (37.2 C) (Oral)  Ht 5\' 4"  (1.626 m)  Wt 249 lb (112.946 kg)  BMI 42.72 kg/m2 Gen: NAD. Pleasant and quiet. Morbidly obese HEENT: AT. [place]. Bilateral TM visualized and normal in appearance. Bilateral eyes without injections or icterus. MMM. Bilateral nares normal. Throat without erythema or exudates. Mild  thyromegaly. CV: RRR. 2/6 systolic murmur appreciated (present since birth) Chest: CTAB, no wheeze or crackles Abd: Soft.NTND. BS present. No Masses palpated.  Ext: No erythema. No edema.

## 2013-09-06 NOTE — Assessment & Plan Note (Addendum)
Blood pressure mildly elevated today, but sounds like she is controlled at home and in other settings. No change to regimen today CBC, CMP, TSH and direct LDL obtained today

## 2013-09-06 NOTE — Assessment & Plan Note (Signed)
Discussed thyroid nodule and workup today. Likely patient was seen at [facility] endocrine. There are no Epic notes suggesting that she was seen a place with a Epic. Phone number to [facility] endocrine was given to patient today to inquire about further followup. Uncertain patient will need referral considering she has been a patient prior, told her to call back if she needs referral be happy to submit one.

## 2013-09-06 NOTE — Assessment & Plan Note (Signed)
Patient declines flu shot today. Information was given on colonoscopy and mammogram. Patient is in understanding and states that she will make an appointment for both in the near future. She is overdue for her Pap and will schedule an appointment as soon as possible.

## 2013-09-06 NOTE — Patient Instructions (Addendum)
Keeping You Healthy  Get These Tests  Blood Pressure- Have your blood pressure checked by your healthcare provider at least once a year.  Normal blood pressure is 130/80.  Weight- Have your body mass index (BMI) calculated to screen for obesity.  BMI is a measure of body fat based on height and weight.  You can calculate your own BMI at https://www.west-esparza.com/  Cholesterol- Have your cholesterol checked every year.  Diabetes- Have your blood sugar checked every year if you have high blood pressure, high cholesterol, a family history of diabetes or if you are overweight.  Pap Smear- Have a pap smear every  3 years if you have been sexually active.  If you are older than 65 and recent pap smears have been normal you may not need additional pap smears.  In addition, if you have had a hysterectomy  For benign disease additional pap smears are not necessary.  Mammogram-Yearly mammograms are essential for early detection of breast cancer  Screening for Colon Cancer- Colonoscopy starting at age 82. Screening may begin sooner depending on your family history and other health conditions.  Follow up colonoscopy as directed by your Gastroenterologist.  Screening for Osteoporosis- Screening begins at age 12 with bone density scanning, sooner if you are at higher risk for developing Osteoporosis.  Get these medicines  Calcium with Vitamin D- Your body requires 1200-1500 mg of Calcium a day and [phone] IU of Vitamin D a day.  You can only absorb 500 mg of Calcium at a time therefore Calcium must be taken in 2 or 3 separate doses throughout the day.  Hormones- Hormone therapy has been associated with increased risk for certain cancers and heart disease.  Talk to your healthcare provider about if you need relief from menopausal symptoms.  Aspirin- Ask your healthcare provider about taking Aspirin to prevent Heart Disease and Stroke.  Get these Immuniztions  Flu shot- Every fall  Pneumonia shot-  Once after the age of 29; if you are younger ask your healthcare provider if you need a pneumonia shot.  Tetanus- Every ten years.  Zostavax- Once after the age of 45 to prevent shingles.  Take these steps  Don't smoke- Your healthcare provider can help you quit. For tips on how to quit, ask your healthcare provider or go to www.smokefree.gov or call 1-800 QUIT-NOW.  Be physically active- Exercise 5 days a week for a minimum of 30 minutes.  If you are not already physically active, start slow and gradually work up to 30 minutes of moderate physical activity.  Try walking, dancing, bike riding, swimming, etc.  Eat a healthy diet- Eat a variety of healthy foods such as fruits, vegetables, whole grains, low fat milk, low fat cheeses, yogurt, lean meats, chicken, fish, eggs, dried beans, tofu, etc.  For more information go to www.thenutritionsource.org  Dental visit- Brush and floss teeth twice daily; visit your dentist twice a year.  Eye exam- Visit your Optometrist or Ophthalmologist yearly.  Drink alcohol in moderation- Limit alcohol intake to one drink or less a day.  Never drink and drive.  Depression- Your emotional health is as important as your physical health.  If you're feeling down or losing interest in things you normally enjoy, please talk to your healthcare provider.  Seat Belts- can save your life; always wear one  Smoke/Carbon Monoxide detectors- These detectors need to be installed on the appropriate level of your home.  Replace batteries at least once a year.  Violence- If anyone is  threatening or hurting you, please tell your healthcare provider.  Living Will/ Health care power of attorney- Discuss with your healthcare provider and family.

## 2013-09-09 ENCOUNTER — Other Ambulatory Visit: Payer: Self-pay | Admitting: Family Medicine

## 2013-09-11 ENCOUNTER — Encounter: Payer: Self-pay | Admitting: Family Medicine

## 2013-09-24 ENCOUNTER — Other Ambulatory Visit: Payer: Self-pay | Admitting: Family Medicine

## 2013-11-28 ENCOUNTER — Other Ambulatory Visit: Payer: Self-pay | Admitting: Family Medicine

## 2014-02-13 ENCOUNTER — Encounter: Payer: Self-pay | Admitting: Family Medicine

## 2014-02-13 ENCOUNTER — Telehealth: Payer: Self-pay | Admitting: *Deleted

## 2014-02-13 NOTE — Telephone Encounter (Signed)
Forms placed in Dr Kuneff's box for completion.Jabree Pernice S Danford Tat  

## 2014-02-13 NOTE — Progress Notes (Signed)
Son dropped off form to be filled out for social services.  Please call him when completed.

## 2014-02-16 ENCOUNTER — Telehealth: Payer: Self-pay | Admitting: Family Medicine

## 2014-02-16 NOTE — Telephone Encounter (Signed)
Pt had son drop off [place] social services paperwork to be a foster parent. There are questions on the form that I need to either speak over the phone with Ms.Mcall or she and her husband are going to have to come into the office for appointment to answer questions. I prefer the latter. I attempted to call today and was told Mrs. Stumpp was not available. I left a message to call her Dr. Isidore Moos. I am out of the office until Monday. Thanks.

## 2014-02-20 ENCOUNTER — Ambulatory Visit: Payer: Medicare Other | Admitting: Home Health Services

## 2014-02-20 ENCOUNTER — Telehealth: Payer: Self-pay | Admitting: Family Medicine

## 2014-02-20 ENCOUNTER — Other Ambulatory Visit: Payer: Self-pay | Admitting: Family Medicine

## 2014-02-20 NOTE — Telephone Encounter (Signed)
Please call Gwendolyn DuffMary Hicks and ask her the questions on evaluation. These questions need to be addressed to her and for her husband. If they are not able to be reached or speak over the phone they will have to make an appointment to complete forms. Thanks.

## 2014-02-21 NOTE — Telephone Encounter (Signed)
I've tried several times to reach patient.I did leave message on home number with patient's mother to have her return my call.Proposito, Virgel BouquetGiovanna S

## 2014-02-21 NOTE — Telephone Encounter (Signed)
Dr Claiborne BillingsKuneff I finally got to speak with both Corrie DandyMary and Sherryle LisWendell Geary.Corrie DandyMary called me back it seems like I spoke to a different person earlier.She states son mis spoke when he said I spoke to her earlier.They both answered all the questions  in the evaluation and there seems to be no concerns at this time.I would like your permission to date and release forms.Proposito, Virgel BouquetGiovanna S

## 2014-04-04 ENCOUNTER — Other Ambulatory Visit: Payer: Self-pay | Admitting: Family Medicine

## 2014-04-19 ENCOUNTER — Emergency Department (HOSPITAL_COMMUNITY)
Admission: EM | Admit: 2014-04-19 | Discharge: 2014-04-19 | Disposition: A | Discharge status: Home / Self Care | Payer: Medicare Other | Attending: Emergency Medicine | Admitting: Emergency Medicine

## 2014-04-19 ENCOUNTER — Encounter (HOSPITAL_COMMUNITY): Payer: Self-pay | Admitting: Emergency Medicine

## 2014-04-19 DIAGNOSIS — Z79899 Other long term (current) drug therapy: Secondary | ICD-10-CM | POA: Insufficient documentation

## 2014-04-19 DIAGNOSIS — Z792 Long term (current) use of antibiotics: Secondary | ICD-10-CM | POA: Insufficient documentation

## 2014-04-19 DIAGNOSIS — R109 Unspecified abdominal pain: Secondary | ICD-10-CM | POA: Diagnosis present

## 2014-04-19 DIAGNOSIS — R112 Nausea with vomiting, unspecified: Secondary | ICD-10-CM | POA: Diagnosis not present

## 2014-04-19 DIAGNOSIS — Z862 Personal history of diseases of the blood and blood-forming organs and certain disorders involving the immune mechanism: Secondary | ICD-10-CM | POA: Insufficient documentation

## 2014-04-19 DIAGNOSIS — I10 Essential (primary) hypertension: Secondary | ICD-10-CM | POA: Diagnosis not present

## 2014-04-19 DIAGNOSIS — Z8659 Personal history of other mental and behavioral disorders: Secondary | ICD-10-CM | POA: Insufficient documentation

## 2014-04-19 DIAGNOSIS — R319 Hematuria, unspecified: Secondary | ICD-10-CM | POA: Diagnosis not present

## 2014-04-19 DIAGNOSIS — K219 Gastro-esophageal reflux disease without esophagitis: Secondary | ICD-10-CM | POA: Diagnosis not present

## 2014-04-19 DIAGNOSIS — N390 Urinary tract infection, site not specified: Secondary | ICD-10-CM | POA: Insufficient documentation

## 2014-04-19 DIAGNOSIS — Z3202 Encounter for pregnancy test, result negative: Secondary | ICD-10-CM | POA: Insufficient documentation

## 2014-04-19 DIAGNOSIS — Z7982 Long term (current) use of aspirin: Secondary | ICD-10-CM | POA: Insufficient documentation

## 2014-04-19 DIAGNOSIS — D72829 Elevated white blood cell count, unspecified: Secondary | ICD-10-CM | POA: Diagnosis not present

## 2014-04-19 HISTORY — DX: Gastro-esophageal reflux disease without esophagitis: K21.9

## 2014-04-19 LAB — CBC WITH DIFFERENTIAL/PLATELET
Basophils Absolute: 0 10*3/uL (ref 0.0–0.1)
Basophils Relative: 0 % (ref 0–1)
EOS PCT: 0 % (ref 0–5)
Eosinophils Absolute: 0 10*3/uL (ref 0.0–0.7)
HCT: 32.8 % — ABNORMAL LOW (ref 36.0–46.0)
Hemoglobin: 11.2 g/dL — ABNORMAL LOW (ref 12.0–15.0)
LYMPHS ABS: 1 10*3/uL (ref 0.7–4.0)
Lymphocytes Relative: 5 % — ABNORMAL LOW (ref 12–46)
MCH: 27.5 pg (ref 26.0–34.0)
MCHC: 34.1 g/dL (ref 30.0–36.0)
MCV: 80.6 fL (ref 78.0–100.0)
Monocytes Absolute: 1.5 10*3/uL — ABNORMAL HIGH (ref 0.1–1.0)
Monocytes Relative: 8 % (ref 3–12)
Neutro Abs: 15.7 10*3/uL — ABNORMAL HIGH (ref 1.7–7.7)
Neutrophils Relative %: 87 % — ABNORMAL HIGH (ref 43–77)
Platelets: 224 10*3/uL (ref 150–400)
RBC: 4.07 MIL/uL (ref 3.87–5.11)
RDW: 13.2 % (ref 11.5–15.5)
WBC: 18.1 10*3/uL — ABNORMAL HIGH (ref 4.0–10.5)

## 2014-04-19 LAB — URINALYSIS, ROUTINE W REFLEX MICROSCOPIC
BILIRUBIN URINE: NEGATIVE
Glucose, UA: NEGATIVE mg/dL
KETONES UR: NEGATIVE mg/dL
NITRITE: POSITIVE — AB
PROTEIN: 30 mg/dL — AB
Specific Gravity, Urine: 1.014 (ref 1.005–1.030)
UROBILINOGEN UA: 0.2 mg/dL (ref 0.0–1.0)
pH: 5.5 (ref 5.0–8.0)

## 2014-04-19 LAB — COMPREHENSIVE METABOLIC PANEL
ALT: 15 U/L (ref 0–35)
AST: 21 U/L (ref 0–37)
Albumin: 4 g/dL (ref 3.5–5.2)
Alkaline Phosphatase: 95 U/L (ref 39–117)
Anion gap: 14 (ref 5–15)
BUN: 17 mg/dL (ref 6–23)
CALCIUM: 9.7 mg/dL (ref 8.4–10.5)
CO2: 22 mEq/L (ref 19–32)
CREATININE: 1.34 mg/dL — AB (ref 0.50–1.10)
Chloride: 98 mEq/L (ref 96–112)
GFR, EST AFRICAN AMERICAN: 50 mL/min — AB (ref >90)
GFR, EST NON AFRICAN AMERICAN: 43 mL/min — AB (ref >90)
GLUCOSE: 167 mg/dL — AB (ref 70–99)
Potassium: 3.9 mEq/L (ref 3.7–5.3)
SODIUM: 134 meq/L — AB (ref 137–147)
Total Bilirubin: 0.9 mg/dL (ref 0.3–1.2)
Total Protein: 8.6 g/dL — ABNORMAL HIGH (ref 6.0–8.3)

## 2014-04-19 LAB — URINE MICROSCOPIC-ADD ON

## 2014-04-19 LAB — PREGNANCY, URINE: PREG TEST UR: NEGATIVE

## 2014-04-19 LAB — LIPASE, BLOOD: LIPASE: 17 U/L (ref 11–59)

## 2014-04-19 MED ORDER — CEPHALEXIN 500 MG PO CAPS: 500.0000 mg | ORAL_CAPSULE | Freq: Four times a day (QID) | ORAL | Status: DC

## 2014-04-19 MED ORDER — ONDANSETRON HCL 4 MG PO TABS: 4.0000 mg | ORAL_TABLET | Freq: Four times a day (QID) | ORAL | Status: DC

## 2014-04-19 MED ORDER — DEXTROSE 5 % IV SOLN
1.0000 g | INTRAVENOUS | Status: DC
  Administered 2014-04-19: 1 g via INTRAVENOUS
  Filled 2014-04-19: qty 10

## 2014-04-19 MED ORDER — ONDANSETRON HCL 4 MG/2ML IJ SOLN
4.0000 mg | Freq: Once | INTRAMUSCULAR | Status: AC
  Administered 2014-04-19: 4 mg via INTRAVENOUS
  Filled 2014-04-19: qty 2

## 2014-04-19 MED ORDER — ACETAMINOPHEN 325 MG PO TABS
650.0000 mg | ORAL_TABLET | Freq: Once | ORAL | Status: AC
  Administered 2014-04-19: 650 mg via ORAL
  Filled 2014-04-19: qty 2

## 2014-04-19 MED ORDER — SODIUM CHLORIDE 0.9 % IV BOLUS (SEPSIS)
1000.0000 mL | Freq: Once | INTRAVENOUS | Status: AC
  Administered 2014-04-19: 1000 mL via INTRAVENOUS

## 2014-04-19 NOTE — ED Notes (Addendum)
Pt reports generalized abd pain with n/v today after eating peach.  Pt reports dysuria x 2 days.  Pt states it could be her acid reflux

## 2014-04-19 NOTE — Discharge Instructions (Signed)
Return to the ED with any concerns including vomiting and not able to keep down liquids or antibiotics, fever/chills, abdominal pain, decreased level of alertness/lethargy, or any other alarming symptoms

## 2014-04-19 NOTE — ED Provider Notes (Signed)
CSN: [id number]     Arrival date & time 04/19/14  1737 History   First MD Initiated Contact with Patient 04/19/14 2033     Chief Complaint  Patient presents with  . Abdominal Pain  . Dysuria     (Consider location/radiation/quality/duration/timing/severity/associated sxs/prior Treatment) HPI Pt presents with c/o nausea vomiting and generalized abdominal pain which began today.  She states she has been having urinary frequency and burning with urination over the past 2 days.  No fever/chills.  She began to have nausea last night after eating supper. Today after eating a peach she developed nausea and vomiting.  Symptoms are constant.  Emesis was nonbloody and nonbilious.  There are no other associated systemic symptoms, there are no other alleviating or modifying factors.  Past Medical History  Diagnosis Date  . Hypertension   . Depression   . Anemia   . GERD (gastroesophageal reflux disease)    Past Surgical History  Procedure Laterality Date  . Cesarean section      2   Family History  Problem Relation Age of Onset  . Hypertension Mother   . Heart disease Mother   . Hypertension Father   . Heart disease Father    History  Substance Use Topics  . Smoking status: Never Smoker   . Smokeless tobacco: Never Used  . Alcohol Use: No   OB History   Grav Para Term Preterm Abortions TAB SAB Ect Mult Living                 Review of Systems ROS reviewed and all otherwise negative except for mentioned in HPI    Allergies  Diphenhydramine hcl  Home Medications   Prior to Admission medications   Medication Sig Start Date End Date Taking? Authorizing Provider  aspirin (ANACIN) 81 MG EC tablet Take 81 mg by mouth daily.     Yes Historical Provider, MD  carvedilol (COREG) 3.125 MG tablet Take 3.125 mg by mouth 2 (two) times daily.   Yes Historical Provider, MD  levothyroxine (SYNTHROID, LEVOTHROID) 125 MCG tablet Take 125 mcg by mouth every morning.   Yes Historical Provider,  MD  lisinopril-hydrochlorothiazide (PRINZIDE,ZESTORETIC) 20-12.5 MG per tablet Take 2 tablets by mouth daily.   Yes Historical Provider, MD  omeprazole (PRILOSEC) 20 MG capsule Take 40 mg by mouth daily.   Yes Historical Provider, MD  cephALEXin (KEFLEX) 500 MG capsule Take 1 capsule (500 mg total) by mouth 4 (four) times daily. 04/19/14   Ethelda Chick, MD  cetirizine (ZYRTEC) 10 MG tablet Take 1 tablet (10 mg total) by mouth daily. One tab daily for allergies 12/06/11 12/05/12  Linna Hoff, MD  ondansetron (ZOFRAN) 4 MG tablet Take 1 tablet (4 mg total) by mouth every 6 (six) hours. 04/19/14   Ethelda Chick, MD   BP 146/76  Pulse 94  Temp(Src) 98.8 F (37.1 C) (Oral)  Resp 18  SpO2 95% Vitals reviewed Physical Exam Physical Examination: General appearance - alert, well appearing, and in no distress Mental status - alert, oriented to person, place, and time Eyes - no scleral icterus,no conjunctival injection Mouth - mucous membranes moist, pharynx normal without lesions Chest - clear to auscultation, no wheezes, rales or rhonchi, symmetric air entry Heart - normal rate, regular rhythm, normal S1, S2, no murmurs, rubs, clicks or gallops Abdomen - soft, nontender, nondistended, no masses or organomegaly, nabs Extremities - peripheral pulses normal, no pedal edema, no clubbing or cyanosis Skin - normal coloration and  turgor, no rashes  ED Course  Procedures (including critical care time) Labs Review Labs Reviewed  CBC WITH DIFFERENTIAL - Abnormal; Notable for the following:    WBC 18.1 (*)    Hemoglobin 11.2 (*)    HCT 32.8 (*)    Neutrophils Relative % 87 (*)    Neutro Abs 15.7 (*)    Lymphocytes Relative 5 (*)    Monocytes Absolute 1.5 (*)    All other components within normal limits  COMPREHENSIVE METABOLIC PANEL - Abnormal; Notable for the following:    Sodium 134 (*)    Glucose, Bld 167 (*)    Creatinine, Ser 1.34 (*)    Total Protein 8.6 (*)    GFR calc non Af Amer 43  (*)    GFR calc Af Amer 50 (*)    All other components within normal limits  URINALYSIS, ROUTINE W REFLEX MICROSCOPIC - Abnormal; Notable for the following:    APPearance CLOUDY (*)    Hgb urine dipstick LARGE (*)    Protein, ur 30 (*)    Nitrite POSITIVE (*)    Leukocytes, UA LARGE (*)    All other components within normal limits  URINE MICROSCOPIC-ADD ON - Abnormal; Notable for the following:    Bacteria, UA FEW (*)    All other components within normal limits  URINE CULTURE  LIPASE, BLOOD  PREGNANCY, URINE    Imaging Review No results found.   EKG Interpretation None      MDM   Final diagnoses:  Urinary tract infection with hematuria, site unspecified  Leukocytosis  Non-intractable vomiting with nausea, vomiting of unspecified type    Pt presenting with nausea and vomiting associated with dysuria.  Also fever.  Pt has UTI on workup in the ED.  Also some leukocytosis.   Patient is overall nontoxic and well hydrated in appearance.  She was treated with IV fluids, nausea medication and started on IV rocephin.  Pt feeling improved and able to keep down liquids.  Discharged with strict return precautions.  Pt agreeable with plan.     Ethelda ChickMartha K Linker, MD 04/22/14 [id number]

## 2014-04-23 LAB — URINE CULTURE

## 2014-04-24 ENCOUNTER — Telehealth (HOSPITAL_BASED_OUTPATIENT_CLINIC_OR_DEPARTMENT_OTHER): Payer: Self-pay | Admitting: Emergency Medicine

## 2014-04-24 NOTE — Progress Notes (Signed)
ED Antimicrobial Stewardship Positive Culture Follow Up   Debria GarretMary C Cosey is an 58 y.o. female who [facility] with a chief complaint of  Chief Complaint  Patient presents with  . Abdominal Pain  . Dysuria    Recent Results (from the past 720 hour(s))  URINE CULTURE     Status: None   Collection Time    04/19/14  8:52 PM      Result Value Ref Range Status   Specimen Description URINE, CLEAN CATCH   Final   Special Requests NONE   Final   Culture  Setup Time     Final   Value: 04/20/2014 01:19     Performed at [facility]   Colony Count     Final   Value: >=100,000 COLONIES/ML     Performed at [facility]   Culture     Final   Value: ESCHERICHIA COLI     Note: Confirmed Extended Spectrum Beta-Lactamase Producer (ESBL)     Performed at [facility]   Report Status 04/23/2014 FINAL   Final   Organism ID, Bacteria ESCHERICHIA COLI   Final    [x]  Treated with cephalexin, organism resistant to prescribed antimicrobial  New antibiotic prescription: Ciprofloxacin 500 mg tablet by mouth every 12 hours for 5 days.    ED Provider: Barbie Haggisiffany Greene   Reubin Bushnell C 04/24/2014, 12:55 PM Infectious Diseases Pharmacist Phone# [phone]

## 2014-04-24 NOTE — Telephone Encounter (Signed)
Post ED Visit - Positive Culture Follow-up: Successful Patient Follow-Up  Culture assessed and recommendations reviewed by: []  Wes Dulaney, Pharm.D., BCPS []  Celedonio MiyamotoJeremy Frens, Pharm.D., BCPS []  Georgina PillionElizabeth Martin, [address]Pharm.D., BCPS []  UnicoiMinh Pham, [place]arm.D., BCPS, AAHIVP []  Estella HuskMichelle Turner, Pharm.D., BCPS, AAHIVP [x]  Red ChristiansSamson Lee, Pharm.D. []  Tennis Mustassie Stewart, Pharm.D.  Positive urine culture >100,000 colonies/ml E. Coli  []  Patient discharged without antimicrobial prescription and treatment is now indicated [x]  Organism is resistant to prescribed ED discharge antimicrobial []  Patient with positive blood cultures  Changes discussed with ED provider: Meredith Stag[facility]Jeffrey Greene [facility] New antibiotic prescription: Ciprofloxacin 500mg  tablet by mouth every 12 hours for 5 days [facility]   , time   Berle MullMiller, Sarissa Dern 04/24/2014, 2:22 PM

## 2014-04-29 ENCOUNTER — Telehealth (HOSPITAL_BASED_OUTPATIENT_CLINIC_OR_DEPARTMENT_OTHER): Payer: Self-pay | Admitting: Emergency Medicine

## 2014-04-29 NOTE — Telephone Encounter (Signed)
ID verified, pt notified of + urine culture and need for change in antibiotic treatment. RX Cipro call to [facility] [phone] voicemail.

## 2014-05-13 ENCOUNTER — Emergency Department (HOSPITAL_COMMUNITY)
Admission: EM | Admit: 2014-05-13 | Discharge: 2014-05-13 | Disposition: A | Discharge status: Home / Self Care | Payer: Medicare Other | Attending: Emergency Medicine | Admitting: Emergency Medicine

## 2014-05-13 ENCOUNTER — Encounter (HOSPITAL_COMMUNITY): Payer: Self-pay | Admitting: Emergency Medicine

## 2014-05-13 DIAGNOSIS — E669 Obesity, unspecified: Secondary | ICD-10-CM | POA: Diagnosis not present

## 2014-05-13 DIAGNOSIS — Z862 Personal history of diseases of the blood and blood-forming organs and certain disorders involving the immune mechanism: Secondary | ICD-10-CM | POA: Diagnosis not present

## 2014-05-13 DIAGNOSIS — I10 Essential (primary) hypertension: Secondary | ICD-10-CM | POA: Diagnosis not present

## 2014-05-13 DIAGNOSIS — Z8659 Personal history of other mental and behavioral disorders: Secondary | ICD-10-CM | POA: Insufficient documentation

## 2014-05-13 DIAGNOSIS — R112 Nausea with vomiting, unspecified: Secondary | ICD-10-CM | POA: Diagnosis not present

## 2014-05-13 DIAGNOSIS — K219 Gastro-esophageal reflux disease without esophagitis: Secondary | ICD-10-CM | POA: Diagnosis not present

## 2014-05-13 DIAGNOSIS — Z7982 Long term (current) use of aspirin: Secondary | ICD-10-CM | POA: Diagnosis not present

## 2014-05-13 DIAGNOSIS — IMO0001 Reserved for inherently not codable concepts without codable children: Secondary | ICD-10-CM

## 2014-05-13 LAB — HEPATIC FUNCTION PANEL
ALK PHOS: 86 U/L (ref 39–117)
ALT: 14 U/L (ref 0–35)
AST: 23 U/L (ref 0–37)
Albumin: 3.9 g/dL (ref 3.5–5.2)
BILIRUBIN TOTAL: 0.5 mg/dL (ref 0.3–1.2)
Total Protein: 8.2 g/dL (ref 6.0–8.3)

## 2014-05-13 LAB — CBC WITH DIFFERENTIAL/PLATELET
BASOS ABS: 0 10*3/uL (ref 0.0–0.1)
BASOS PCT: 0 % (ref 0–1)
EOS ABS: 0.1 10*3/uL (ref 0.0–0.7)
Eosinophils Relative: 1 % (ref 0–5)
HCT: 33.4 % — ABNORMAL LOW (ref 36.0–46.0)
Hemoglobin: 11.4 g/dL — ABNORMAL LOW (ref 12.0–15.0)
Lymphocytes Relative: 30 % (ref 12–46)
Lymphs Abs: 2.5 10*3/uL (ref 0.7–4.0)
MCH: 27.5 pg (ref 26.0–34.0)
MCHC: 34.1 g/dL (ref 30.0–36.0)
MCV: 80.5 fL (ref 78.0–100.0)
MONO ABS: 0.7 10*3/uL (ref 0.1–1.0)
MONOS PCT: 8 % (ref 3–12)
NEUTROS ABS: 4.9 10*3/uL (ref 1.7–7.7)
NEUTROS PCT: 61 % (ref 43–77)
Platelets: 248 10*3/uL (ref 150–400)
RBC: 4.15 MIL/uL (ref 3.87–5.11)
RDW: 13.3 % (ref 11.5–15.5)
WBC: 8.1 10*3/uL (ref 4.0–10.5)

## 2014-05-13 LAB — BASIC METABOLIC PANEL
Anion gap: 11 (ref 5–15)
BUN: 14 mg/dL (ref 6–23)
CHLORIDE: 103 meq/L (ref 96–112)
CO2: 26 mEq/L (ref 19–32)
CREATININE: 1.32 mg/dL — AB (ref 0.50–1.10)
Calcium: 9.6 mg/dL (ref 8.4–10.5)
GFR calc Af Amer: 50 mL/min — ABNORMAL LOW (ref >90)
GFR, EST NON AFRICAN AMERICAN: 43 mL/min — AB (ref >90)
Glucose, Bld: 146 mg/dL — ABNORMAL HIGH (ref 70–99)
POTASSIUM: 3.9 meq/L (ref 3.7–5.3)
Sodium: 140 mEq/L (ref 137–147)

## 2014-05-13 LAB — URINALYSIS, ROUTINE W REFLEX MICROSCOPIC
BILIRUBIN URINE: NEGATIVE
Glucose, UA: NEGATIVE mg/dL
HGB URINE DIPSTICK: NEGATIVE
Ketones, ur: NEGATIVE mg/dL
Leukocytes, UA: NEGATIVE
Nitrite: NEGATIVE
PROTEIN: NEGATIVE mg/dL
Specific Gravity, Urine: 1.013 (ref 1.005–1.030)
Urobilinogen, UA: 0.2 mg/dL (ref 0.0–1.0)
pH: 5.5 (ref 5.0–8.0)

## 2014-05-13 MED ORDER — ONDANSETRON 4 MG PO TBDP
8.0000 mg | ORAL_TABLET | Freq: Once | ORAL | Status: AC
  Administered 2014-05-13: 8 mg via ORAL
  Filled 2014-05-13: qty 2

## 2014-05-13 MED ORDER — GI COCKTAIL ~~LOC~~
30.0000 mL | Freq: Once | ORAL | Status: AC
  Administered 2014-05-13: 30 mL via ORAL
  Filled 2014-05-13: qty 30

## 2014-05-13 MED ORDER — SODIUM CHLORIDE 0.9 % IV BOLUS (SEPSIS)
1000.0000 mL | Freq: Once | INTRAVENOUS | Status: AC
  Administered 2014-05-13: 1000 mL via INTRAVENOUS

## 2014-05-13 MED ORDER — ONDANSETRON HCL 4 MG/2ML IJ SOLN
4.0000 mg | Freq: Once | INTRAMUSCULAR | Status: AC
  Administered 2014-05-13: 4 mg via INTRAVENOUS
  Filled 2014-05-13: qty 2

## 2014-05-13 NOTE — ED Notes (Signed)
The pt has been vomiting since this am.  No pain just pressure in her abd.  She reports that she has these episodes and she has med for this but the med is not helping.  She feels like she is vomiting acid.  Vomiting in triage sweating

## 2014-05-13 NOTE — Discharge Instructions (Signed)
Call for a follow up appointment with a Family or Primary Care Provider.  °Return if Symptoms worsen.   °Take medication as prescribed.  ° °

## 2014-05-13 NOTE — ED Provider Notes (Signed)
CSN: [id number]     Arrival date & time 05/13/14  1513 History   First MD Initiated Contact with Patient 05/13/14 1809     Chief Complaint  Patient presents with  . Emesis     (Consider location/radiation/quality/duration/timing/severity/associated sxs/prior Treatment) HPI Comments: The patient is a 58 year old female past other history of hypertension, anemia, reflux presenting to emergency room chief complaint of vomiting since this morning. Patient reports multiple episodes of bilious emesis denies blood. The patient denies abdominal pain, constipation, diarrhea. Last bowel movement one normal and one small no blood. No known sick contacts, no recent travel, no fever.  She reports similar symptoms with reflux in the past, onset after eating multiple fried meals over the last 24 hours.  The history is provided by the patient. No language interpreter was used.    Past Medical History  Diagnosis Date  . Hypertension   . Depression   . Anemia   . GERD (gastroesophageal reflux disease)    Past Surgical History  Procedure Laterality Date  . Cesarean section      2   Family History  Problem Relation Age of Onset  . Hypertension Mother   . Heart disease Mother   . Hypertension Father   . Heart disease Father    History  Substance Use Topics  . Smoking status: Never Smoker   . Smokeless tobacco: Never Used  . Alcohol Use: No   OB History   Grav Para Term Preterm Abortions TAB SAB Ect Mult Living                 Review of Systems  Constitutional: Negative for fever and chills.  Gastrointestinal: Positive for vomiting. Negative for abdominal pain, diarrhea, constipation and blood in stool.  Genitourinary: Negative for dysuria and flank pain.      Allergies  Diphenhydramine hcl  Home Medications   Prior to Admission medications   Medication Sig Start Date End Date Taking? Authorizing Provider  aspirin (ANACIN) 81 MG EC tablet Take 81 mg by mouth daily.    Yes  Historical Provider, MD  carvedilol (COREG) 3.125 MG tablet Take 3.125 mg by mouth 2 (two) times daily.   Yes Historical Provider, MD  levothyroxine (SYNTHROID, LEVOTHROID) 125 MCG tablet Take 125 mcg by mouth every morning.   Yes Historical Provider, MD  lisinopril-hydrochlorothiazide (PRINZIDE,ZESTORETIC) 20-12.5 MG per tablet Take 2 tablets by mouth daily.   Yes Historical Provider, MD  omeprazole (PRILOSEC) 20 MG capsule Take 40 mg by mouth daily.   Yes Historical Provider, MD  ondansetron (ZOFRAN) 4 MG tablet Take 1 tablet (4 mg total) by mouth every 6 (six) hours. 04/19/14  Yes Ethelda Chick, MD  cetirizine (ZYRTEC) 10 MG tablet Take 1 tablet (10 mg total) by mouth daily. One tab daily for allergies 12/06/11 12/05/12  Linna Hoff, MD   BP 144/96  Pulse 103  Temp(Src) 99.1 F (37.3 C) (Oral)  Resp 22  SpO2 99% Physical Exam  Nursing note and vitals reviewed. Constitutional: She is oriented to person, place, and time. She appears well-developed and well-nourished. No distress.  Obese female  HENT:  Head: Normocephalic and atraumatic.  Eyes: No scleral icterus.  Neck: Neck supple.  Cardiovascular: Normal rate and regular rhythm.   Patient not tachycardic on exam  Pulmonary/Chest: Effort normal and breath sounds normal. No respiratory distress. She has no wheezes. She has no rales.  Abdominal: Soft. She exhibits no distension. There is no tenderness. There is no rebound,  no guarding and no CVA tenderness.  Musculoskeletal: Normal range of motion.  Neurological: She is alert and oriented to person, place, and time.  Skin: Skin is warm and dry. She is not diaphoretic.  Psychiatric: She has a normal mood and affect. Her behavior is normal.    ED Course  Procedures (including critical care time) Labs Review Labs Reviewed  CBC WITH DIFFERENTIAL - Abnormal; Notable for the following:    Hemoglobin 11.4 (*)    HCT 33.4 (*)    All other components within normal limits  BASIC  METABOLIC PANEL - Abnormal; Notable for the following:    Glucose, Bld 146 (*)    Creatinine, Ser 1.32 (*)    GFR calc non Af Amer 43 (*)    GFR calc Af Amer 50 (*)    All other components within normal limits  URINALYSIS, ROUTINE W REFLEX MICROSCOPIC  HEPATIC FUNCTION PANEL    Imaging Review No results found.   EKG Interpretation None      MDM   Final diagnoses:  Non-intractable vomiting with nausea, vomiting of unspecified type  Reflux   Patient presents with multiple episodes of emesis and reflux symptoms. Abdomen soft nontender, afebrile. Plan to treat with fluids and Zofran and reevaluate. Labs without concerning abnormalities, BMP shows creatinine 1.32 stable from previous. Hepatic function negative. UA without sign of infection. 7:31 PM Reevaluation patient denies further emesis reports improvement of symptoms. Reports a likely reflux after eating fried food. GI cocktail ordered. Reevaluation patient reports complete resolution of symptoms no further emesis is tolerating by mouth intake. Discussed lab results,and treatment plan with the patient. Return precautions given. Reports understanding and no other concerns at this time.  Patient is stable for discharge at this time.  Meds given in ED:  Medications  sodium chloride 0.9 % bolus 1,000 mL (not administered)  ondansetron (ZOFRAN) injection 4 mg (not administered)  ondansetron (ZOFRAN-ODT) disintegrating tablet 8 mg (8 mg Oral Given 05/13/14 1526)    New Prescriptions   No medications on file      Mellody Drown, PA-C 05/14/14 0110

## 2014-05-14 NOTE — ED Provider Notes (Signed)
Medical screening examination/treatment/procedure(s) were performed by non-physician practitioner and as supervising physician I was immediately available for consultation/collaboration.   EKG Interpretation None        Amzie Sillas N Nahuel Wilbert, DO 05/14/14 1923 

## 2014-06-08 ENCOUNTER — Ambulatory Visit: Payer: Medicare Other | Admitting: Family Medicine

## 2014-06-22 ENCOUNTER — Telehealth: Payer: Self-pay | Admitting: Home Health Services

## 2014-06-22 NOTE — Telephone Encounter (Signed)
LM time to schedule annual physical with PCP.  

## 2014-07-12 ENCOUNTER — Other Ambulatory Visit: Payer: Self-pay | Admitting: Family Medicine

## 2014-07-12 MED ORDER — LEVOTHYROXINE SODIUM 125 MCG PO TABS: 125.0000 ug | ORAL_TABLET | Freq: Every morning | ORAL | Status: DC

## 2014-07-12 NOTE — Telephone Encounter (Signed)
Pt called and would like a refill on her thyroid medication. She said the the pharmacy has sent us request for a couple of days now. jw

## 2014-07-13 ENCOUNTER — Other Ambulatory Visit: Payer: Self-pay | Admitting: Family Medicine

## 2014-07-13 MED ORDER — LISINOPRIL-HYDROCHLOROTHIAZIDE 20-12.5 MG PO TABS: 2.0000 | ORAL_TABLET | Freq: Every day | ORAL | Status: DC

## 2014-07-13 MED ORDER — OMEPRAZOLE 20 MG PO CPDR: 40.0000 mg | DELAYED_RELEASE_CAPSULE | Freq: Every day | ORAL | Status: DC

## 2014-07-13 NOTE — Telephone Encounter (Signed)
Needs refills on omeprazole and lisinpril

## 2014-07-13 NOTE — Telephone Encounter (Signed)
LM with female for patient to call office to inform her that rx was sent in by Dr Claiborne BillingsKuneff

## 2014-08-25 ENCOUNTER — Other Ambulatory Visit: Payer: Self-pay | Admitting: *Deleted

## 2014-08-25 MED ORDER — CARVEDILOL 3.125 MG PO TABS: 3.1250 mg | ORAL_TABLET | Freq: Two times a day (BID) | ORAL | Status: DC

## 2015-07-11 ENCOUNTER — Other Ambulatory Visit: Payer: Self-pay | Admitting: Family Medicine

## 2015-07-16 ENCOUNTER — Other Ambulatory Visit: Payer: Self-pay | Admitting: Family Medicine

## 2015-07-16 NOTE — Telephone Encounter (Signed)
Needs refill on prilosec [facility] [address]

## 2015-07-17 ENCOUNTER — Other Ambulatory Visit: Payer: Self-pay | Admitting: Family Medicine

## 2015-07-25 ENCOUNTER — Ambulatory Visit (INDEPENDENT_AMBULATORY_CARE_PROVIDER_SITE_OTHER): Payer: Medicare Other | Admitting: Family Medicine

## 2015-07-25 VITALS — BP 149/77 | HR 65 | Temp 98.5°F | Wt 255.5 lb

## 2015-07-25 DIAGNOSIS — I10 Essential (primary) hypertension: Secondary | ICD-10-CM | POA: Diagnosis not present

## 2015-07-25 DIAGNOSIS — N183 Chronic kidney disease, stage 3 unspecified: Secondary | ICD-10-CM

## 2015-07-25 DIAGNOSIS — E039 Hypothyroidism, unspecified: Secondary | ICD-10-CM

## 2015-07-25 DIAGNOSIS — K219 Gastro-esophageal reflux disease without esophagitis: Secondary | ICD-10-CM | POA: Diagnosis not present

## 2015-07-25 DIAGNOSIS — R739 Hyperglycemia, unspecified: Secondary | ICD-10-CM

## 2015-07-25 LAB — POCT GLYCOSYLATED HEMOGLOBIN (HGB A1C): Hemoglobin A1C: 5.3

## 2015-07-25 LAB — COMPLETE METABOLIC PANEL WITH GFR
ALT: 10 U/L (ref 6–29)
AST: 17 U/L (ref 10–35)
Albumin: 4.4 g/dL (ref 3.6–5.1)
Alkaline Phosphatase: 79 U/L (ref 33–130)
BILIRUBIN TOTAL: 0.3 mg/dL (ref 0.2–1.2)
BUN: 16 mg/dL (ref 7–25)
CO2: 28 mmol/L (ref 20–31)
CREATININE: 1.36 mg/dL — AB (ref 0.50–0.99)
Calcium: 9.8 mg/dL (ref 8.6–10.4)
Chloride: 103 mmol/L (ref 98–110)
GFR, Est African American: 49 mL/min — ABNORMAL LOW (ref >=60)
GFR, Est Non African American: 42 mL/min — ABNORMAL LOW (ref >=60)
GLUCOSE: 100 mg/dL — AB (ref 65–99)
Potassium: 4.1 mmol/L (ref 3.5–5.3)
SODIUM: 138 mmol/L (ref 135–146)
TOTAL PROTEIN: 8.1 g/dL (ref 6.1–8.1)

## 2015-07-25 LAB — CBC WITH DIFFERENTIAL/PLATELET
Basophils Absolute: 0 10*3/uL (ref 0.0–0.1)
Basophils Relative: 0 % (ref 0–1)
EOS PCT: 1 % (ref 0–5)
Eosinophils Absolute: 0.1 10*3/uL (ref 0.0–0.7)
HEMATOCRIT: 34.8 % — AB (ref 36.0–46.0)
HEMOGLOBIN: 11.3 g/dL — AB (ref 12.0–15.0)
LYMPHS ABS: 3.5 10*3/uL (ref 0.7–4.0)
LYMPHS PCT: 33 % (ref 12–46)
MCH: 26.9 pg (ref 26.0–34.0)
MCHC: 32.5 g/dL (ref 30.0–36.0)
MCV: 82.9 fL (ref 78.0–100.0)
MPV: 10.2 fL (ref 8.6–12.4)
Monocytes Absolute: 0.7 10*3/uL (ref 0.1–1.0)
Monocytes Relative: 7 % (ref 3–12)
NEUTROS ABS: 6.3 10*3/uL (ref 1.7–7.7)
Neutrophils Relative %: 59 % (ref 43–77)
Platelets: 273 10*3/uL (ref 150–400)
RBC: 4.2 MIL/uL (ref 3.87–5.11)
RDW: 14 % (ref 11.5–15.5)
WBC: 10.6 10*3/uL — AB (ref 4.0–10.5)

## 2015-07-25 LAB — LIPID PANEL
Cholesterol: 163 mg/dL (ref 125–200)
HDL: 52 mg/dL (ref >=46)
LDL CALC: 96 mg/dL (ref <130)
Total CHOL/HDL Ratio: 3.1 Ratio (ref <=5.0)
Triglycerides: 75 mg/dL (ref <150)
VLDL: 15 mg/dL (ref <30)

## 2015-07-25 LAB — TSH: TSH: 3.097 u[IU]/mL (ref 0.350–4.500)

## 2015-07-25 MED ORDER — LISINOPRIL-HYDROCHLOROTHIAZIDE 20-12.5 MG PO TABS: 2.0000 | ORAL_TABLET | Freq: Every day | ORAL | Status: DC

## 2015-07-25 MED ORDER — LEVOTHYROXINE SODIUM 125 MCG PO TABS: 125.0000 ug | ORAL_TABLET | Freq: Every morning | ORAL | Status: DC

## 2015-07-25 MED ORDER — OMEPRAZOLE 20 MG PO CPDR: 20.0000 mg | DELAYED_RELEASE_CAPSULE | Freq: Every day | ORAL | Status: DC

## 2015-07-25 NOTE — Patient Instructions (Signed)
Thank you for coming to see me today. It was a pleasure. Today we talked about:   Hypertension: Your blood pressure is a little higher than I would like. I will see what it is like when you come back  Hypothyroidism: I am checking your thyroid hormone  GERD: I am refilling your omeprazole.  Please make an appointment to see me in one month for follow-up.  If you have any questions or concerns, please do not hesitate to call the office at [phone].  Sincerely,  Jacquelin Hawkingalph Elspeth Blucher, MD

## 2015-07-25 NOTE — Progress Notes (Signed)
    Subjective    Gwendolyn Hicks is a 59 y.o. female that presents for a follow-up visit for chronic issues.   1. Hypertension: Patient adherent with lisinopril-hctz 20-12.5mg  and coreg 3.125mg  BID. No chest pain or dyspnea.  2. Hypothyroidism: Patient adherent with levothyroxine 125mcg daily.   3. GERD: Patient is taking Prilosec for this issue. She has improvement in symptoms. She has also minimized foods that aggravate her reflux.  Social History  Substance Use Topics  . Smoking status: Never Smoker   . Smokeless tobacco: Never Used  . Alcohol Use: No    Allergies  Allergen Reactions  . Diphenhydramine Hcl Itching    No orders of the defined types were placed in this encounter.    ROS  Per HPI   Objective   BP 149/77 mmHg  Pulse 65  Temp(Src) 98.5 F (36.9 C) (Oral)  Wt 255 lb 8 oz (115.894 kg)  General: Well appearing HEENT: No thyromegaly  Assessment and Plan   Please refer to problem based charting of assessment and plan

## 2015-07-31 NOTE — Assessment & Plan Note (Signed)
Adherent with regimen. Blood pressure above goal today. No red flags. Follow-up at next visit and may need to increase

## 2015-07-31 NOTE — Assessment & Plan Note (Signed)
No changes to medication regimen. Repeat TSH today

## 2015-07-31 NOTE — Assessment & Plan Note (Signed)
-   Recheck A1C today

## 2015-07-31 NOTE — Assessment & Plan Note (Signed)
Refill omeprazole. Minimize aggravating foods.

## 2015-08-06 ENCOUNTER — Ambulatory Visit (INDEPENDENT_AMBULATORY_CARE_PROVIDER_SITE_OTHER): Payer: Medicare Other | Admitting: Family Medicine

## 2015-08-06 ENCOUNTER — Encounter: Payer: Self-pay | Admitting: Family Medicine

## 2015-08-06 VITALS — BP 138/78 | HR 78 | Temp 98.4°F | Wt 254.1 lb

## 2015-08-06 DIAGNOSIS — J069 Acute upper respiratory infection, unspecified: Secondary | ICD-10-CM | POA: Diagnosis not present

## 2015-08-06 MED ORDER — GUAIFENESIN-CODEINE 100-10 MG/5ML PO SOLN: 5.0000 mL | Freq: Four times a day (QID) | ORAL | Status: DC | PRN

## 2015-08-06 NOTE — Progress Notes (Signed)
    Subjective: RU:EAVWUCC:sinus infection HPI: Patient is a 59 y.o. female presenting to clinic today for same day appt. Concerns today include:  Sinus issues Patient reports dry cough, congestion, and runny nose since Thursday.  She reports chills last night but no fevers.  She reports that she gets sinus infection yearly.  Has not had one this year.  Denies diarrhea, nausea, vomiting, shortness of breath.  She reports sick contacts at church.  Patient has not used any OTCs for her cold.  Social History Reviewed: non smoker. FamHx and MedHx updated.  Please see EMR. Health Maintenance: declines flu shot  ROS: Per HPI  Objective: Office vital signs reviewed. BP 138/78 mmHg  Pulse 78  Temp(Src) 98.4 F (36.9 C) (Oral)  Wt 254 lb 1.6 oz (115.259 kg)  SpO2 97%  Physical Examination:  General: Awake, alert, obese, No acute distress HEENT: Normal    Neck: No masses palpated. No lymphadenopathy    Ears: Tympanic membranes intact, normal light reflex, no erythema, no bulging    Eyes: PERRLA, EOMI    Nose: nasal turbinates moist, pale, boggy    Throat: moist mucus membranes, no erythema, no tonsillar exudates Cardio: regular rate and rhythm, S1S2 heard, no murmurs appreciated Pulm: clear to auscultation bilaterally, no wheezes, rhonchi or rales, normal work of breathing Extremities: warm, well perfused, No edema, cyanosis or clubbing; +2 radial pulses bilaterally MSK: Normal gait and station  Assessment/ Plan: 59 y.o. female   1. Acute upper respiratory infection.  Likely viral.  No evidence of superimposed bacterial infection at this time. - supportive care - encourage by mouth hydration and rest - may use tylenol as needed  - guaiFENesin-codeine 100-10 MG/5ML syrup; Take 5-10 mLs by mouth every 6 (six) hours as needed for cough.  Dispense: 120 mL; Refill: 0, does not appear to be covered by ins.  Prometh/Cod and Tussionex also not covered.  If too expensive, could consider OTC  Mucinex and honey for cough suppressant/ sore throat. - Patient to avoid products with pseudoephedrine in setting of hypertension  - return precautions reviewed - patient to follow up if no improvement in symptoms   Raliegh IpAshly M Gottschalk, DO PGY-2, [facility]

## 2015-08-06 NOTE — Patient Instructions (Signed)
I think you have a viral upper respiratory infection (cold).  I have given you a prescription for a cough and decongestant medication.  If your symptoms worsen, please come back for reevaluation.  Upper Respiratory Infection, Adult Most upper respiratory infections (URIs) are a viral infection of the air passages leading to the lungs. A URI affects the nose, throat, and upper air passages. The most common type of URI is nasopharyngitis and is typically referred to as "the common cold." URIs run their course and usually go away on their own. Most of the time, a URI does not require medical attention, but sometimes a bacterial infection in the upper airways can follow a viral infection. This is called a secondary infection. Sinus and middle ear infections are common types of secondary upper respiratory infections. Bacterial pneumonia can also complicate a URI. A URI can worsen asthma and chronic obstructive pulmonary disease (COPD). Sometimes, these complications can require emergency medical care and may be life threatening.  CAUSES Almost all URIs are caused by viruses. A virus is a type of germ and can spread from one person to another.  RISKS FACTORS You may be at risk for a URI if:   You smoke.   You have chronic heart or lung disease.  You have a weakened defense (immune) system.   You are very young or very old.   You have nasal allergies or asthma.  You work in crowded or poorly ventilated areas.  You work in health care facilities or schools. SIGNS AND SYMPTOMS  Symptoms typically develop 2-3 days after you come in contact with a cold virus. Most viral URIs last 7-10 days. However, viral URIs from the influenza virus (flu virus) can last 14-18 days and are typically more severe. Symptoms may include:   Runny or stuffy (congested) nose.   Sneezing.   Cough.   Sore throat.   Headache.   Fatigue.   Fever.   Loss of appetite.   Pain in your forehead, behind  your eyes, and over your cheekbones (sinus pain).  Muscle aches.  DIAGNOSIS  Your health care provider may diagnose a URI by:  Physical exam.  Tests to check that your symptoms are not due to another condition such as:  Strep throat.  Sinusitis.  Pneumonia.  Asthma. TREATMENT  A URI goes away on its own with time. It cannot be cured with medicines, but medicines may be prescribed or recommended to relieve symptoms. Medicines may help:  Reduce your fever.  Reduce your cough.  Relieve nasal congestion. HOME CARE INSTRUCTIONS   Take medicines only as directed by your health care provider.   Gargle warm saltwater or take cough drops to comfort your throat as directed by your health care provider.  Use a warm mist humidifier or inhale steam from a shower to increase air moisture. This may make it easier to breathe.  Drink enough fluid to keep your urine clear or pale yellow.   Eat soups and other clear broths and maintain good nutrition.   Rest as needed.   Return to work when your temperature has returned to normal or as your health care provider advises. You may need to stay home longer to avoid infecting others. You can also use a face mask and careful hand washing to prevent spread of the virus.  Increase the usage of your inhaler if you have asthma.   Do not use any tobacco products, including cigarettes, chewing tobacco, or electronic cigarettes. If you need help  quitting, ask your health care provider. PREVENTION  The best way to protect yourself from getting a cold is to practice good hygiene.   Avoid oral or hand contact with people with cold symptoms.   Wash your hands often if contact occurs.  There is no clear evidence that vitamin C, vitamin E, echinacea, or exercise reduces the chance of developing a cold. However, it is always recommended to get plenty of rest, exercise, and practice good nutrition.  SEEK MEDICAL CARE IF:   You are getting worse  rather than better.   Your symptoms are not controlled by medicine.   You have chills.  You have worsening shortness of breath.  You have brown or red mucus.  You have yellow or brown nasal discharge.  You have pain in your face, especially when you bend forward.  You have a fever.  You have swollen neck glands.  You have pain while swallowing.  You have white areas in the back of your throat. SEEK IMMEDIATE MEDICAL CARE IF:   You have severe or persistent:  Headache.  Ear pain.  Sinus pain.  Chest pain.  You have chronic lung disease and any of the following:  Wheezing.  Prolonged cough.  Coughing up blood.  A change in your usual mucus.  You have a stiff neck.  You have changes in your:  Vision.  Hearing.  Thinking.  Mood. MAKE SURE YOU:   Understand these instructions.  Will watch your condition.  Will get help right away if you are not doing well or get worse.   This information is not intended to replace advice given to you by your health care provider. Make sure you discuss any questions you have with your health care provider.   Document Released: 02/18/2001 Document Revised: 01/09/2015 Document Reviewed: 11/30/2013 Elsevier Interactive Patient Education [employer].

## 2015-08-28 ENCOUNTER — Encounter: Payer: Self-pay | Admitting: Family Medicine

## 2015-08-28 ENCOUNTER — Ambulatory Visit (INDEPENDENT_AMBULATORY_CARE_PROVIDER_SITE_OTHER): Payer: Medicare Other | Admitting: Family Medicine

## 2015-08-28 VITALS — BP 143/72 | HR 80 | Temp 98.2°F | Ht 64.0 in | Wt 249.2 lb

## 2015-08-28 DIAGNOSIS — N183 Chronic kidney disease, stage 3 unspecified: Secondary | ICD-10-CM

## 2015-08-28 DIAGNOSIS — I10 Essential (primary) hypertension: Secondary | ICD-10-CM | POA: Diagnosis not present

## 2015-08-28 DIAGNOSIS — Z1211 Encounter for screening for malignant neoplasm of colon: Secondary | ICD-10-CM | POA: Diagnosis not present

## 2015-08-28 DIAGNOSIS — Z0000 Encounter for general adult medical examination without abnormal findings: Secondary | ICD-10-CM

## 2015-08-28 LAB — POCT URINALYSIS DIPSTICK
Bilirubin, UA: NEGATIVE
GLUCOSE UA: NEGATIVE
Ketones, UA: NEGATIVE
Leukocytes, UA: NEGATIVE
NITRITE UA: NEGATIVE
PROTEIN UA: NEGATIVE
RBC UA: NEGATIVE
Spec Grav, UA: 1.015
UROBILINOGEN UA: 0.2
pH, UA: 6

## 2015-08-28 NOTE — Assessment & Plan Note (Signed)
Will obtain urinalysis, albumin/creatinine ratio and renal ultrasound. Minimize nephrotoxic agents.

## 2015-08-28 NOTE — Progress Notes (Signed)
    Subjective    Gwendolyn Hicks is a 59 y.o. female that presents for a follow-up visit for chronic issues.   1. Hypertension: Patient is adherent with coreg 3.125mg  BID and lisinopril-hctz 20-12.5mg  daily. She has not taken her medication today. At her last visit, her blood pressure was controlled. No chest pain or dyspnea.  2. Chronic kidney disease: Patient has a long-standing course of CKD3. She denies ever having an ultrasound. She sometimes has urgency but not often. No dysuria or frequency.  3. Healthcare maintenance: Patient is due for Colonoscopy, pap smear, mammogram. She is also due for a tetanus and Zostavax. Regarding labs, she is due for hepatitis C and HIV screening. She is scared to get a colonoscopy because of a bad experience with general anesthesia where there was difficulty in arousing her.  Social History  Substance Use Topics  . Smoking status: Never Smoker   . Smokeless tobacco: Never Used  . Alcohol Use: No    Allergies  Allergen Reactions  . Diphenhydramine Hcl Itching    No orders of the defined types were placed in this encounter.    ROS  Per HPI   Objective   BP 143/72 mmHg  Pulse 80  Temp(Src) 98.2 F (36.8 C) (Oral)  Ht 5\' 4"  (1.626 m)  Wt 249 lb 3.2 oz (113.036 kg)  BMI 42.75 kg/m2  General: Well appearing, no distress Respiratory/Chest: Clear to auscultation bilaterally. Unlabored work of breathing. No wheezing or rales. Cardiovascular: Regular rate and rhythm. Normal S1 and S2. 1/6 systolic murmur. No extra heart sounds  Assessment and Plan    HYPERTENSION, BENIGN SYSTEMIC Not controlled today, however, patient has not taken medication yet. Last sick day visit, her blood pressure was controlled. Will not make any changes to regimen today. Follow-up in 3 months  Health care maintenance Discussed mammogram, colonoscopy, HIV, Hepatitis C, flu vaccination and pap smear. Patient hesitant to commit to any of these screenings. She states she  will commit to getting pap and colonoscopy next year in January. She declines the flu shot for fear of getting sick. She will get HIV and hepatitis C screening when she next gets her TSH checked.  Chronic kidney disease (CKD), stage III (moderate) Will obtain urinalysis, albumin/creatinine ratio and renal ultrasound. Minimize nephrotoxic agents.

## 2015-08-28 NOTE — Assessment & Plan Note (Signed)
Not controlled today, however, patient has not taken medication yet. Last sick day visit, her blood pressure was controlled. Will not make any changes to regimen today. Follow-up in 3 months

## 2015-08-28 NOTE — Patient Instructions (Addendum)
Thank you for coming to see me today. It was a pleasure. Today we talked about:   Hypertension: No changes to your medications today. Please take your medications daily  Chronic Kidney Disease: I am getting some urine sample and an ultrasound of your kidneys  Healthcare maintenance: I am referring you for a colonoscopy. Also, I am giving you information for a mammogram. We discussed vaccinations, however, you decline at this time. Next time I obtain blood to check your thyroid hormone level, I will check for hepatitis C and HIV. You stated that you will make an appointment for pap smear in the near future.  Please make an appointment to see me in 3 months for follow-up.  If you have any questions or concerns, please do not hesitate to call the office at [phone].  Sincerely,  Jacquelin Hawkingalph Osiah Haring, MD

## 2015-08-28 NOTE — Assessment & Plan Note (Signed)
Discussed mammogram, colonoscopy, HIV, Hepatitis C, flu vaccination and pap smear. Patient hesitant to commit to any of these screenings. She states she will commit to getting pap and colonoscopy next year in January. She declines the flu shot for fear of getting sick. She will get HIV and hepatitis C screening when she next gets her TSH checked.

## 2015-08-29 LAB — MICROALBUMIN / CREATININE URINE RATIO
Creatinine, Urine: 155 mg/dL (ref 20–320)
MICROALB UR: 0.3 mg/dL
Microalb Creat Ratio: 2 mcg/mg creat (ref <30)

## 2015-08-30 ENCOUNTER — Encounter: Payer: Self-pay | Admitting: Family Medicine

## 2015-09-03 ENCOUNTER — Other Ambulatory Visit: Payer: Self-pay | Admitting: Family Medicine

## 2015-09-05 ENCOUNTER — Telehealth: Payer: Self-pay | Admitting: Family Medicine

## 2015-09-05 ENCOUNTER — Other Ambulatory Visit: Payer: Self-pay | Admitting: Family Medicine

## 2015-09-05 MED ORDER — CARVEDILOL 3.125 MG PO TABS: 3.1250 mg | ORAL_TABLET | Freq: Two times a day (BID) | ORAL | Status: DC

## 2015-09-05 NOTE — Telephone Encounter (Signed)
Prescription refill sent.

## 2015-09-05 NOTE — Telephone Encounter (Signed)
Pt called because her prescription for Carvedilol was denied. The problem was they sent this DR. Claiborne BillingsKuneff instead of Dr. Caleb PoppNettey. Can we get this call in since she has been waiting since 09/03/15. Myriam Jacobsonjw

## 2015-09-19 ENCOUNTER — Telehealth: Payer: Self-pay | Admitting: Family Medicine

## 2015-09-19 NOTE — Telephone Encounter (Signed)
Pt called and needs a referral to see the dentist. Gwendolyn Jacobsonjw

## 2015-09-21 ENCOUNTER — Ambulatory Visit (HOSPITAL_COMMUNITY): Payer: Medicare Other

## 2015-09-24 NOTE — Telephone Encounter (Addendum)
LM for pt to call the office. If she calls, please inform her to call the number for dental on the back of her insurance card to find a dentist that will take her insurance. Sunday SpillersSharon T Saunders, CMA

## 2015-09-28 ENCOUNTER — Ambulatory Visit (HOSPITAL_COMMUNITY)
Admission: RE | Admit: 2015-09-28 | Discharge: 2015-09-28 | Disposition: A | Discharge status: Home / Self Care | Payer: Medicare Other | Source: Ambulatory Visit | Attending: Family Medicine | Admitting: Family Medicine

## 2015-09-28 DIAGNOSIS — R93421 Abnormal radiologic findings on diagnostic imaging of right kidney: Secondary | ICD-10-CM | POA: Diagnosis not present

## 2015-09-28 DIAGNOSIS — N183 Chronic kidney disease, stage 3 unspecified: Secondary | ICD-10-CM

## 2015-10-04 ENCOUNTER — Telehealth: Payer: Self-pay | Admitting: Family Medicine

## 2015-10-04 DIAGNOSIS — K219 Gastro-esophageal reflux disease without esophagitis: Principal | ICD-10-CM

## 2015-10-04 DIAGNOSIS — IMO0001 Reserved for inherently not codable concepts without codable children: Secondary | ICD-10-CM

## 2015-10-04 NOTE — Telephone Encounter (Signed)
Patient was referred for a colonoscopy but wants to also get the test done to check for the acid reflux.  She is needing a referral put in for that too.

## 2015-10-05 NOTE — Telephone Encounter (Signed)
Referral placed.

## 2015-10-05 NOTE — Telephone Encounter (Signed)
Spoke with spouse and he states that while they were at their initial appt for patient's colonoscopy she mentioned her acid reflux concern and they said she would need a separate referral.  Since patient would be seeing them for a problem vs. Screening.  Will send message back to MD to place this order. Jazmin Hartsell,CMA

## 2015-10-05 NOTE — Telephone Encounter (Signed)
I do not think she will need a separate referral. Unsure of which acid reflux test she is referring to, but the gastroenterologist would be the same doctor I would refer her to.

## 2015-10-10 DIAGNOSIS — Z1211 Encounter for screening for malignant neoplasm of colon: Secondary | ICD-10-CM | POA: Diagnosis not present

## 2015-10-10 DIAGNOSIS — K5730 Diverticulosis of large intestine without perforation or abscess without bleeding: Secondary | ICD-10-CM | POA: Diagnosis not present

## 2015-11-05 ENCOUNTER — Telehealth: Payer: Self-pay | Admitting: Family Medicine

## 2015-11-05 DIAGNOSIS — R9341 Abnormal radiologic findings on diagnostic imaging of renal pelvis, ureter, or bladder: Secondary | ICD-10-CM

## 2015-11-05 NOTE — Telephone Encounter (Signed)
Called to discuss results of ultrasound. Patient not available. Left message to call back. Ultrasound significant for kidney abnormality that is indeterminate. Possible cyst vs other etiology. Will place order for MRI w/wo contrast per radiology recommendations for further evaluation of renal abnormality. Please set this up for patient. Will try and get in touch to explain situation.

## 2015-11-05 NOTE — Telephone Encounter (Signed)
Called GI, they will be giving pt a call to schedule MRI. Spoke to Dr. Caleb Popp and informed him of situation. Sunday Spillers, CMA

## 2015-11-07 NOTE — Telephone Encounter (Signed)
Pt returned call

## 2015-11-12 NOTE — Telephone Encounter (Signed)
MRI scheudled for 11/19/15.  Fulton Merry, Maryjo RochesterJessica Dawn, CMA

## 2015-11-19 ENCOUNTER — Ambulatory Visit
Admission: RE | Admit: 2015-11-19 | Discharge: 2015-11-19 | Disposition: A | Discharge status: Home / Self Care | Payer: Medicare Other | Source: Ambulatory Visit | Attending: Family Medicine | Admitting: Family Medicine

## 2015-11-19 DIAGNOSIS — R9341 Abnormal radiologic findings on diagnostic imaging of renal pelvis, ureter, or bladder: Secondary | ICD-10-CM

## 2015-11-19 DIAGNOSIS — N281 Cyst of kidney, acquired: Secondary | ICD-10-CM | POA: Diagnosis not present

## 2015-11-19 MED ORDER — GADOBENATE DIMEGLUMINE 529 MG/ML IV SOLN
10.0000 mL | Freq: Once | INTRAVENOUS | Status: AC | PRN
  Administered 2015-11-19: 10 mL via INTRAVENOUS

## 2015-11-26 ENCOUNTER — Telehealth: Payer: Self-pay | Admitting: Family Medicine

## 2015-11-26 ENCOUNTER — Encounter: Payer: Self-pay | Admitting: Family Medicine

## 2015-11-26 NOTE — Telephone Encounter (Signed)
Physician Telephone Note  Reason for call: Discuss results  Discussion with patient: Family member answered phone. Asked to have patient call office back. Called to discuss results of MRI. Per MRI, renal cyst appears benign. No further imaging or work up is necessary for this lesion. There is nothing further for the patient to do.   Jacquelin Hawkingalph Marquell Saenz, MD PGY-3, [facility] 11/26/2015, 9:58 AM

## 2015-12-05 ENCOUNTER — Telehealth: Payer: Self-pay | Admitting: Family Medicine

## 2015-12-05 NOTE — Telephone Encounter (Signed)
Son dropped off form to be filled out for social services.  Please call him when ready to be picked up.

## 2015-12-06 NOTE — Telephone Encounter (Signed)
Form put in Dr. Dennison NancyNettey's box to be filled out. Sunday SpillersSharon T Saunders, CMA

## 2015-12-07 NOTE — Telephone Encounter (Signed)
Please have patient follow-up with me regarding social services paper work so we can discuss some items for me to fill out. She also needs follow-up of her chronic issues. I will fill out the paper work at that time.

## 2015-12-11 NOTE — Telephone Encounter (Signed)
Pt has appointment scheduled for 12/17/2015. Gwendolyn Hicks, Lanayah Gartley D, [place]

## 2015-12-17 ENCOUNTER — Ambulatory Visit: Payer: Medicare Other | Admitting: Family Medicine

## 2016-01-25 ENCOUNTER — Ambulatory Visit (INDEPENDENT_AMBULATORY_CARE_PROVIDER_SITE_OTHER): Payer: Medicare Other | Admitting: Family Medicine

## 2016-01-25 ENCOUNTER — Encounter: Payer: Self-pay | Admitting: Family Medicine

## 2016-01-25 VITALS — BP 127/85 | HR 78 | Temp 98.1°F | Ht 64.0 in | Wt 245.5 lb

## 2016-01-25 DIAGNOSIS — R011 Cardiac murmur, unspecified: Secondary | ICD-10-CM | POA: Diagnosis not present

## 2016-01-25 DIAGNOSIS — I10 Essential (primary) hypertension: Secondary | ICD-10-CM

## 2016-01-25 DIAGNOSIS — E039 Hypothyroidism, unspecified: Secondary | ICD-10-CM | POA: Diagnosis not present

## 2016-01-25 NOTE — Progress Notes (Signed)
    Subjective    Gwendolyn GarretMary C Hicks is a 60 y.o. female that presents for a follow-up visit for:   1. Hypothyroidism: Patient is adherent with synthroid 125mcg daily. She reports no side effects from synthroid.  2. Hypertension: Patient is adherent with lisinopril-hydrochlorothiazide 20-12.5mg  day twice per day in addition to carvedilol 3.125mg  twice per day. No chest pain, dyspnea or leg swelling.   Social History  Substance Use Topics  . Smoking status: Never Smoker   . Smokeless tobacco: Never Used  . Alcohol Use: No    Allergies  Allergen Reactions  . Diphenhydramine Hcl Itching    No orders of the defined types were placed in this encounter.    ROS  Per HPI   Objective   BP 127/85 mmHg  Pulse 78  Temp(Src) 98.1 F (36.7 C) (Oral)  Ht 5\' 4"  (1.626 m)  Wt 245 lb 8 oz (111.358 kg)  BMI 42.12 kg/m2  Vital signs reviewed  General: Well appearing, no distress  Assessment and Plan    HYPERTENSION, BENIGN SYSTEMIC Controlled. No changes.  Hypothyroidism Will recheck TSH today. Asymptomatic.

## 2016-01-25 NOTE — Patient Instructions (Signed)
Thank you for coming to see me today. It was a pleasure. Today we talked about:   Hypothyroidism: I will check your TSH today  Hypertension: No changes today  Please make an appointment to see your new doctor in 6 months.  If you have any questions or concerns, please do not hesitate to call the office at [phone].  Sincerely,  Jacquelin Hawkingalph Ardyth Kelso, MD

## 2016-01-26 LAB — TSH: TSH: 0.44 m[IU]/L

## 2016-01-28 NOTE — Assessment & Plan Note (Signed)
Will recheck TSH today. Asymptomatic.

## 2016-01-28 NOTE — Assessment & Plan Note (Signed)
Controlled. No changes. 

## 2016-02-11 ENCOUNTER — Other Ambulatory Visit: Payer: Self-pay | Admitting: Family Medicine

## 2016-02-12 ENCOUNTER — Other Ambulatory Visit: Payer: Self-pay | Admitting: *Deleted

## 2016-02-13 ENCOUNTER — Encounter: Payer: Self-pay | Admitting: Family Medicine

## 2016-03-10 ENCOUNTER — Other Ambulatory Visit: Payer: Self-pay | Admitting: Family Medicine

## 2016-03-12 NOTE — Telephone Encounter (Signed)
Called patient to discuss ongoing need for Omeprazole.  It appears that she has been taking this medication for several years for GERD.  I wanted to make sure that she was aware of the osteoporosis risk associated with prolonged use of medication before I refilled it.  If patient calls back, please verify with her.  If she wishes to continue medication, I will refill it but would recommend that we discuss continued use at next appointment.  Gwendolyn Hicks M. Nadine CountsGottschalk, DO PGY-3, [facility]

## 2016-04-12 ENCOUNTER — Other Ambulatory Visit: Payer: Self-pay | Admitting: Family Medicine

## 2016-05-15 ENCOUNTER — Other Ambulatory Visit: Payer: Self-pay | Admitting: Family Medicine

## 2016-06-12 ENCOUNTER — Other Ambulatory Visit: Payer: Self-pay | Admitting: Family Medicine

## 2016-07-05 ENCOUNTER — Other Ambulatory Visit: Payer: Self-pay | Admitting: Family Medicine

## 2016-07-09 ENCOUNTER — Encounter (HOSPITAL_COMMUNITY): Payer: Self-pay

## 2016-07-09 ENCOUNTER — Emergency Department (HOSPITAL_COMMUNITY)
Admission: EM | Admit: 2016-07-09 | Discharge: 2016-07-09 | Disposition: A | Discharge status: Home / Self Care | Payer: Medicare Other | Attending: Physician Assistant | Admitting: Physician Assistant

## 2016-07-09 DIAGNOSIS — Z5321 Procedure and treatment not carried out due to patient leaving prior to being seen by health care provider: Secondary | ICD-10-CM | POA: Diagnosis not present

## 2016-07-09 DIAGNOSIS — K219 Gastro-esophageal reflux disease without esophagitis: Secondary | ICD-10-CM | POA: Diagnosis not present

## 2016-07-09 LAB — COMPREHENSIVE METABOLIC PANEL
ALBUMIN: 4 g/dL (ref 3.5–5.0)
ALK PHOS: 87 U/L (ref 38–126)
ALT: 12 U/L — AB (ref 14–54)
AST: 22 U/L (ref 15–41)
Anion gap: 8 (ref 5–15)
BUN: 14 mg/dL (ref 6–20)
CHLORIDE: 105 mmol/L (ref 101–111)
CO2: 25 mmol/L (ref 22–32)
CREATININE: 1.52 mg/dL — AB (ref 0.44–1.00)
Calcium: 9.8 mg/dL (ref 8.9–10.3)
GFR calc non Af Amer: 36 mL/min — ABNORMAL LOW (ref >60)
GFR, EST AFRICAN AMERICAN: 42 mL/min — AB (ref >60)
GLUCOSE: 131 mg/dL — AB (ref 65–99)
Potassium: 4.3 mmol/L (ref 3.5–5.1)
SODIUM: 138 mmol/L (ref 135–145)
Total Bilirubin: 0.6 mg/dL (ref 0.3–1.2)
Total Protein: 8.4 g/dL — ABNORMAL HIGH (ref 6.5–8.1)

## 2016-07-09 LAB — CBC
HCT: 33.1 % — ABNORMAL LOW (ref 36.0–46.0)
HEMOGLOBIN: 11.1 g/dL — AB (ref 12.0–15.0)
MCH: 27.6 pg (ref 26.0–34.0)
MCHC: 33.5 g/dL (ref 30.0–36.0)
MCV: 82.3 fL (ref 78.0–100.0)
PLATELETS: 268 10*3/uL (ref 150–400)
RBC: 4.02 MIL/uL (ref 3.87–5.11)
RDW: 13 % (ref 11.5–15.5)
WBC: 10.2 10*3/uL (ref 4.0–10.5)

## 2016-07-09 LAB — LIPASE, BLOOD: LIPASE: 24 U/L (ref 11–51)

## 2016-07-09 NOTE — ED Triage Notes (Signed)
Patient here with GERD x 4 days, states she feels nauseated with same. Taking reflux meds with no relief. Has had similar flare up in past. No CP, no pain in abdomen

## 2016-07-09 NOTE — ED Notes (Signed)
Call to reassess vitals and they did not answer.

## 2016-07-09 NOTE — ED Notes (Addendum)
Pt called for room multiple time no answer triage RN aware

## 2016-07-09 NOTE — ED Notes (Signed)
Pt called for room multiple times no answer 

## 2016-07-29 ENCOUNTER — Other Ambulatory Visit: Payer: Self-pay | Admitting: *Deleted

## 2016-07-29 ENCOUNTER — Other Ambulatory Visit: Payer: Self-pay | Admitting: Family Medicine

## 2016-07-29 MED ORDER — OMEPRAZOLE 20 MG PO CPDR: 20.0000 mg | DELAYED_RELEASE_CAPSULE | Freq: Every day | ORAL | 1 refills | Status: DC

## 2016-07-29 MED ORDER — LISINOPRIL-HYDROCHLOROTHIAZIDE 20-12.5 MG PO TABS: 2.0000 | ORAL_TABLET | Freq: Every day | ORAL | 1 refills | Status: DC

## 2016-07-29 MED ORDER — LEVOTHYROXINE SODIUM 125 MCG PO TABS: 125.0000 ug | ORAL_TABLET | Freq: Every morning | ORAL | 11 refills | Status: DC

## 2016-07-29 NOTE — Telephone Encounter (Signed)
Please have patient schedule an appt to be seen.  Rx's have been filled x2 months.

## 2016-07-29 NOTE — Telephone Encounter (Signed)
t is calling for refills on her Prilosec, Levothyroxine, and Lisinopril. She said that the pharmacy has been faxing us. jw

## 2016-08-04 NOTE — Telephone Encounter (Signed)
LM with pt son to have her call back.  If she calls back please give her the below information and assist her in getting an appointment scheduled. Lamonte SakaiZimmerman Rumple, April D, [place]

## 2016-09-05 ENCOUNTER — Other Ambulatory Visit: Payer: Self-pay | Admitting: Family Medicine

## 2016-09-05 MED ORDER — LISINOPRIL-HYDROCHLOROTHIAZIDE 20-12.5 MG PO TABS: 2.0000 | ORAL_TABLET | Freq: Every day | ORAL | 0 refills | Status: DC

## 2016-09-05 NOTE — Telephone Encounter (Signed)
30 day supply given. Patient needs appointment for further refills.  Katina Degreealeb M. Jimmey RalphParker, MD [facility] Resident PGY-3 09/05/2016 11:42 AM

## 2016-09-05 NOTE — Telephone Encounter (Signed)
Pt needs a refill on lisinopril. Pt uses [facility] on [address]. ep

## 2016-09-10 ENCOUNTER — Other Ambulatory Visit: Payer: Self-pay | Admitting: Family Medicine

## 2016-09-10 NOTE — Telephone Encounter (Signed)
LM for pt to call the office. If pt calls, please ask which medication is she trying to pick up?  Sunday SpillersSharon T Saunders, CMA

## 2016-09-10 NOTE — Telephone Encounter (Signed)
Pt called because the pharmacy sad that before she can pick up her medication we have to call them. Please call pharmacy. She is not sure what the problem is. Myriam Jacobsonjw

## 2016-09-11 ENCOUNTER — Other Ambulatory Visit: Payer: Self-pay

## 2016-09-11 MED ORDER — LEVOTHYROXINE SODIUM 125 MCG PO TABS: 125.0000 ug | ORAL_TABLET | Freq: Every morning | ORAL | 11 refills | Status: DC

## 2016-09-11 NOTE — Telephone Encounter (Signed)
While on the phone with patients son he mentioned she also needed a refill on her levothyroxine, sent request to provider. Maryjean Mornempestt S Lilla Callejo, CMA

## 2016-09-11 NOTE — Addendum Note (Signed)
Addended by: Maryjean MornOBERTS, Maralee Higuchi S on: 09/11/2016 10:29 AM   Modules accepted: Orders

## 2016-09-11 NOTE — Telephone Encounter (Signed)
Pt needs a refill on levothyroxine. Pt uses [facility] @ [address]. Pt states there was an issue about getting it refilled at the pharmacy and that the pharmacist needed to speak with us in order to refill it. ep

## 2016-09-11 NOTE — Telephone Encounter (Signed)
Spoke with patients son and scheduled her follow up for 10/01/16. Maryjean Mornempestt S Roberts, CMA

## 2016-09-11 NOTE — Telephone Encounter (Signed)
Refills sent to pharmacy and appointment scheduled for patient. Maryjean Mornempestt S Roberts, CMA

## 2016-09-12 ENCOUNTER — Telehealth: Payer: Self-pay | Admitting: Internal Medicine

## 2016-09-12 NOTE — Telephone Encounter (Signed)
**  After Hours/ Emergency Line Call*  Received a call to report that Gwendolyn Hicks has been having trouble getting her thyroid medication. She states that the pharmacy "won't release it to her" until they talk to her doctor. She does not know what the issue is, but her son was unable to pick up the medication today. Per chart review, Pt's PCP refilled the medication on 1/4. I spoke with her pharmacy and they state that the medication is ready for her to pick up.  Will forward to PCP.  Hilton SinclairKaty D Miamarie Moll, MD PGY-2, [facility]

## 2016-10-01 ENCOUNTER — Ambulatory Visit: Payer: Medicare Other | Admitting: Family Medicine

## 2016-10-09 ENCOUNTER — Emergency Department (HOSPITAL_COMMUNITY)
Admission: EM | Admit: 2016-10-09 | Discharge: 2016-10-09 | Disposition: A | Discharge status: Home / Self Care | Payer: Medicare Other | Attending: Emergency Medicine | Admitting: Emergency Medicine

## 2016-10-09 ENCOUNTER — Encounter (HOSPITAL_COMMUNITY): Payer: Self-pay | Admitting: Family Medicine

## 2016-10-09 DIAGNOSIS — E876 Hypokalemia: Secondary | ICD-10-CM | POA: Diagnosis not present

## 2016-10-09 DIAGNOSIS — R1110 Vomiting, unspecified: Secondary | ICD-10-CM | POA: Diagnosis present

## 2016-10-09 DIAGNOSIS — N289 Disorder of kidney and ureter, unspecified: Secondary | ICD-10-CM | POA: Diagnosis not present

## 2016-10-09 DIAGNOSIS — E039 Hypothyroidism, unspecified: Secondary | ICD-10-CM | POA: Diagnosis not present

## 2016-10-09 DIAGNOSIS — Z7982 Long term (current) use of aspirin: Secondary | ICD-10-CM | POA: Insufficient documentation

## 2016-10-09 DIAGNOSIS — Z79899 Other long term (current) drug therapy: Secondary | ICD-10-CM | POA: Insufficient documentation

## 2016-10-09 DIAGNOSIS — R140 Abdominal distension (gaseous): Secondary | ICD-10-CM | POA: Diagnosis not present

## 2016-10-09 DIAGNOSIS — D649 Anemia, unspecified: Secondary | ICD-10-CM

## 2016-10-09 DIAGNOSIS — I129 Hypertensive chronic kidney disease with stage 1 through stage 4 chronic kidney disease, or unspecified chronic kidney disease: Secondary | ICD-10-CM | POA: Diagnosis not present

## 2016-10-09 DIAGNOSIS — N183 Chronic kidney disease, stage 3 (moderate): Secondary | ICD-10-CM | POA: Insufficient documentation

## 2016-10-09 LAB — DIFFERENTIAL
BASOS PCT: 0 %
Basophils Absolute: 0 10*3/uL (ref 0.0–0.1)
EOS PCT: 1 %
Eosinophils Absolute: 0.1 10*3/uL (ref 0.0–0.7)
Lymphocytes Relative: 34 %
Lymphs Abs: 3.9 10*3/uL (ref 0.7–4.0)
MONO ABS: 0.8 10*3/uL (ref 0.1–1.0)
Monocytes Relative: 7 %
NEUTROS PCT: 58 %
Neutro Abs: 6.7 10*3/uL (ref 1.7–7.7)

## 2016-10-09 LAB — LIPASE, BLOOD: LIPASE: 16 U/L (ref 11–51)

## 2016-10-09 LAB — COMPREHENSIVE METABOLIC PANEL
ALK PHOS: 83 U/L (ref 38–126)
ALT: 14 U/L (ref 14–54)
AST: 21 U/L (ref 15–41)
Albumin: 4.5 g/dL (ref 3.5–5.0)
Anion gap: 10 (ref 5–15)
BUN: 18 mg/dL (ref 6–20)
CALCIUM: 9.5 mg/dL (ref 8.9–10.3)
CHLORIDE: 101 mmol/L (ref 101–111)
CO2: 25 mmol/L (ref 22–32)
CREATININE: 1.35 mg/dL — AB (ref 0.44–1.00)
GFR, EST AFRICAN AMERICAN: 48 mL/min — AB (ref >60)
GFR, EST NON AFRICAN AMERICAN: 41 mL/min — AB (ref >60)
Glucose, Bld: 131 mg/dL — ABNORMAL HIGH (ref 65–99)
Potassium: 3.2 mmol/L — ABNORMAL LOW (ref 3.5–5.1)
SODIUM: 136 mmol/L (ref 135–145)
Total Bilirubin: 0.8 mg/dL (ref 0.3–1.2)
Total Protein: 8.8 g/dL — ABNORMAL HIGH (ref 6.5–8.1)

## 2016-10-09 LAB — CBC
HCT: 32.5 % — ABNORMAL LOW (ref 36.0–46.0)
Hemoglobin: 11 g/dL — ABNORMAL LOW (ref 12.0–15.0)
MCH: 27.4 pg (ref 26.0–34.0)
MCHC: 33.8 g/dL (ref 30.0–36.0)
MCV: 81 fL (ref 78.0–100.0)
PLATELETS: 263 10*3/uL (ref 150–400)
RBC: 4.01 MIL/uL (ref 3.87–5.11)
RDW: 12.9 % (ref 11.5–15.5)
WBC: 11.6 10*3/uL — ABNORMAL HIGH (ref 4.0–10.5)

## 2016-10-09 MED ORDER — POTASSIUM CHLORIDE CRYS ER 20 MEQ PO TBCR
40.0000 meq | EXTENDED_RELEASE_TABLET | Freq: Once | ORAL | Status: AC
  Administered 2016-10-09: 40 meq via ORAL
  Filled 2016-10-09: qty 2

## 2016-10-09 MED ORDER — GI COCKTAIL ~~LOC~~: 30.0000 mL | Freq: Once | ORAL | Status: DC

## 2016-10-09 MED ORDER — ONDANSETRON HCL 4 MG PO TABS: 4.0000 mg | ORAL_TABLET | Freq: Four times a day (QID) | ORAL | 0 refills | Status: DC | PRN

## 2016-10-09 MED ORDER — POTASSIUM CHLORIDE CRYS ER 20 MEQ PO TBCR: 20.0000 meq | EXTENDED_RELEASE_TABLET | Freq: Two times a day (BID) | ORAL | 0 refills | Status: DC

## 2016-10-09 MED ORDER — ONDANSETRON HCL 4 MG/2ML IJ SOLN
4.0000 mg | Freq: Once | INTRAMUSCULAR | Status: AC | PRN
  Administered 2016-10-09: 4 mg via INTRAVENOUS
  Filled 2016-10-09: qty 2

## 2016-10-09 NOTE — ED Provider Notes (Deleted)
[facility]-EMERGENCY DEPT Provider Note   CSN: [id number] Arrival date & time: 10/09/16  0231     History   Chief Complaint Chief Complaint  Patient presents with  . Abdominal Pain  . Emesis    HPI Gwendolyn Hicks is a 61 y.o. female.  HPI  Past Medical History:  Diagnosis Date  . Anemia   . Depression   . GERD (gastroesophageal reflux disease)   . Hypertension     Patient Active Problem List   Diagnosis Date Noted  . Systolic murmur 01/25/2016  . Health care maintenance 09/06/2013  . Thyroid nodule 06/04/2012  . Thyromegaly 05/26/2012  . Hyperglycemia 09/30/2011  . Chronic kidney disease (CKD), stage III (moderate) 04/15/2010  . Hypothyroidism 11/05/2006  . OBESITY, NOS 11/05/2006  . HYPERTENSION, BENIGN SYSTEMIC 11/05/2006  . GASTROESOPHAGEAL REFLUX, NO ESOPHAGITIS 11/05/2006    Past Surgical History:  Procedure Laterality Date  . CESAREAN SECTION     2    OB History    No data available       Home Medications    Prior to Admission medications   Medication Sig Start Date End Date Taking? Authorizing Provider  aspirin (ANACIN) 81 MG EC tablet Take 81 mg by mouth daily.    Yes Historical Provider, MD  carvedilol (COREG) 3.125 MG tablet Take 1 tablet (3.125 mg total) by mouth 2 (two) times daily. 09/05/15  Yes Narda Bondsalph A Nettey, MD  levothyroxine (SYNTHROID, LEVOTHROID) 125 MCG tablet Take 1 tablet (125 mcg total) by mouth every morning. 09/11/16  Yes Ashly Hulen SkainsM Gottschalk, DO  lisinopril-hydrochlorothiazide (PRINZIDE,ZESTORETIC) 20-12.5 MG tablet Take 2 tablets by mouth daily. 09/05/16  Yes Ardith Darkaleb M Parker, MD  omeprazole (PRILOSEC) 20 MG capsule Take 1 capsule (20 mg total) by mouth daily. 07/29/16  Yes Ashly M Gottschalk, DO  ondansetron (ZOFRAN) 4 MG tablet Take 1 tablet (4 mg total) by mouth every 6 (six) hours as needed for nausea. 10/09/16   Dione Boozeavid Sofya Moustafa, MD  potassium chloride SA (K-DUR,KLOR-CON) 20 MEQ tablet Take 1 tablet (20 mEq total) by mouth 2 (two) times  daily. 10/09/16   Dione Boozeavid Yoshiaki Kreuser, MD    Family History Family History  Problem Relation Age of Onset  . Hypertension Mother   . Heart disease Mother   . Hypertension Father   . Heart disease Father     Social History Social History  Substance Use Topics  . Smoking status: Never Smoker  . Smokeless tobacco: Never Used  . Alcohol use No     Allergies   Diphenhydramine hcl   Review of Systems Review of Systems   Physical Exam Updated Vital Signs BP (!) 147/110 (BP Location: Left Arm)   Pulse 117   Temp 97.5 F (36.4 C) (Oral)   Resp 18   Ht 5\' 4"  (1.626 m)   Wt 242 lb 4 oz (109.9 kg)   SpO2 98%   BMI 41.58 kg/m   Physical Exam   ED Treatments / Results  Labs (all labs ordered are listed, but only abnormal results are displayed) Labs Reviewed  COMPREHENSIVE METABOLIC PANEL - Abnormal; Notable for the following:       Result Value   Potassium 3.2 (*)    Glucose, Bld 131 (*)    Creatinine, Ser 1.35 (*)    Total Protein 8.8 (*)    GFR calc non Af Amer 41 (*)    GFR calc Af Amer 48 (*)    All other components within normal limits  CBC -  Abnormal; Notable for the following:    WBC 11.6 (*)    Hemoglobin 11.0 (*)    HCT 32.5 (*)    All other components within normal limits  LIPASE, BLOOD  URINALYSIS, ROUTINE W REFLEX MICROSCOPIC  DIFFERENTIAL    Procedures Procedures (including critical care time)  Medications Ordered in ED Medications  potassium chloride SA (K-DUR,KLOR-CON) CR tablet 40 mEq (not administered)  ondansetron (ZOFRAN) injection 4 mg (4 mg Intravenous Given 10/09/16 0322)     Initial Impression / Assessment and Plan / ED Course  I have reviewed the triage vital signs and the nursing notes.  Pertinent lab results that were available during my care of the patient were reviewed by me and considered in my medical decision making (see chart for details).  Abdominal bloating with benign exam. Laboratory workup shows mild anemia which is  unchanged from baseline, mild renal insufficiency which is actually improved from most recent value. Hypokalemia is noted which is presumably diuretic induced. Patient was offered a GI cocktail but refused. She has history of GERD and this seems to be what is behind her symptoms currently. She is currently taking omeprazole. She is advised to increase this to twice a day. She is discharged with prescription for ondansetron and K Dur. Follow-up with her PCP in 2 days.  Final Clinical Impressions(s) / ED Diagnoses   Final diagnoses:  Abdominal bloating  Hypokalemia  Renal insufficiency  Normochromic normocytic anemia    New Prescriptions New Prescriptions   ONDANSETRON (ZOFRAN) 4 MG TABLET    Take 1 tablet (4 mg total) by mouth every 6 (six) hours as needed for nausea.   POTASSIUM CHLORIDE SA (K-DUR,KLOR-CON) 20 MEQ TABLET    Take 1 tablet (20 mEq total) by mouth 2 (two) times daily.     Dione Booze, MD 10/09/16 [phone]

## 2016-10-09 NOTE — ED Notes (Signed)
Pt reports understanding of discharge information. No questions at time of discharge 

## 2016-10-09 NOTE — ED Triage Notes (Signed)
Patient reports she is experiencing upper abd pain with nausea. Symptoms started 2 days ago. Pt is spitting salvia but no vomiting. Denies fever. Took Mylanta earlier.

## 2016-10-09 NOTE — Discharge Instructions (Signed)
Take your omeprazole (Prilosec) twice a day for the next two weeks.

## 2016-10-09 NOTE — ED Provider Notes (Signed)
[facility] Provider Note   CSN: [id number] Arrival date & time: 10/09/16  0231   By signing my name below, I, Clovis Pu, attest that this documentation has been prepared under the direction and in the presence of Dione Booze, MD  Electronically Signed: Clovis Pu, ED Scribe. 10/09/16. 3:50 AM.   History   Chief Complaint Chief Complaint  Patient presents with  . Abdominal Pain  . Emesis   The history is provided by the patient. No language interpreter was used.   HPI Comments:  Gwendolyn Hicks is a 61 y.o. female, with a hx of GERD on omeprazole, who presents to the Emergency Department complaining of acute onset abdominal bloating onset 4 days. Pt also reports belching, nausea and a decrease in appetite. Nothing makes her symptoms worse. Her pain is better with laying down. She has taken Mylanta with little relief. Pt denies abdominal pain. No other associated symptoms noted.    PCP: Delynn Flavin, DO   Past Medical History:  Diagnosis Date  . Anemia   . Depression   . GERD (gastroesophageal reflux disease)   . Hypertension     Patient Active Problem List   Diagnosis Date Noted  . Systolic murmur 01/25/2016  . Health care maintenance 09/06/2013  . Thyroid nodule 06/04/2012  . Thyromegaly 05/26/2012  . Hyperglycemia 09/30/2011  . Chronic kidney disease (CKD), stage III (moderate) 04/15/2010  . Hypothyroidism 11/05/2006  . OBESITY, NOS 11/05/2006  . HYPERTENSION, BENIGN SYSTEMIC 11/05/2006  . GASTROESOPHAGEAL REFLUX, NO ESOPHAGITIS 11/05/2006    Past Surgical History:  Procedure Laterality Date  . CESAREAN SECTION     2    OB History    No data available       Home Medications    Prior to Admission medications   Medication Sig Start Date End Date Taking? Authorizing Provider  aspirin (ANACIN) 81 MG EC tablet Take 81 mg by mouth daily.    Yes Historical Provider, MD  carvedilol (COREG) 3.125 MG tablet Take 1 tablet (3.125 mg total) by mouth  2 (two) times daily. 09/05/15  Yes Narda Bonds, MD  levothyroxine (SYNTHROID, LEVOTHROID) 125 MCG tablet Take 1 tablet (125 mcg total) by mouth every morning. 09/11/16  Yes Ashly Hulen Skains, DO  lisinopril-hydrochlorothiazide (PRINZIDE,ZESTORETIC) 20-12.5 MG tablet Take 2 tablets by mouth daily. 09/05/16  Yes Ardith Dark, MD  omeprazole (PRILOSEC) 20 MG capsule Take 1 capsule (20 mg total) by mouth daily. 07/29/16  Yes Ashly Hulen Skains, DO  cetirizine (ZYRTEC) 10 MG tablet Take 1 tablet (10 mg total) by mouth daily. One tab daily for allergies Patient not taking: Reported on 10/09/2016 12/06/11 12/05/12  Linna Hoff, MD  guaiFENesin-codeine 100-10 MG/5ML syrup Take 5-10 mLs by mouth every 6 (six) hours as needed for cough. Patient not taking: Reported on 10/09/2016 08/06/15   Ashly M Gottschalk, DO  ondansetron (ZOFRAN) 4 MG tablet Take 1 tablet (4 mg total) by mouth every 6 (six) hours. Patient not taking: Reported on 10/09/2016 04/19/14   Jerelyn Scott, MD    Family History Family History  Problem Relation Age of Onset  . Hypertension Mother   . Heart disease Mother   . Hypertension Father   . Heart disease Father     Social History Social History  Substance Use Topics  . Smoking status: Never Smoker  . Smokeless tobacco: Never Used  . Alcohol use No     Allergies   Diphenhydramine hcl   Review of Systems  Review of Systems  Constitutional: Positive for appetite change. Negative for fever.  Gastrointestinal: Positive for nausea. Negative for abdominal pain.  All other systems reviewed and are negative.    Physical Exam Updated Vital Signs BP (!) 147/110 (BP Location: Left Arm)   Pulse 117   Temp 97.5 F (36.4 C) (Oral)   Resp 18   Ht 5\' 4"  (1.626 m)   Wt 242 lb 4 oz (109.9 kg)   SpO2 98%   BMI 41.58 kg/m   Physical Exam  Constitutional: She is oriented to person, place, and time. She appears well-developed and well-nourished.  Intermittently spitting out  saliva  HENT:  Head: Normocephalic and atraumatic.  Eyes: EOM are normal. Pupils are equal, round, and reactive to light.  Neck: Normal range of motion. Neck supple. No JVD present.  Cardiovascular: Normal rate, regular rhythm and normal heart sounds.   No murmur heard. Pulmonary/Chest: Effort normal and breath sounds normal. She has no wheezes. She has no rales. She exhibits no tenderness.  Abdominal: Soft. Bowel sounds are normal. She exhibits no distension and no mass. There is no tenderness.  Musculoskeletal: Normal range of motion. She exhibits no edema.  Lymphadenopathy:    She has no cervical adenopathy.  Neurological: She is alert and oriented to person, place, and time. No cranial nerve deficit. She exhibits normal muscle tone. Coordination normal.  Skin: Skin is warm and dry. No rash noted.  Psychiatric: She has a normal mood and affect. Her behavior is normal. Judgment and thought content normal.  Nursing note and vitals reviewed.    ED Treatments / Results  DIAGNOSTIC STUDIES:  Oxygen Saturation is 98% on RA, normal by my interpretation.    COORDINATION OF CARE:  3:49 AM Discussed treatment plan with pt at bedside and pt agreed to plan.  Labs (all labs ordered are listed, but only abnormal results are displayed) Labs Reviewed  COMPREHENSIVE METABOLIC PANEL - Abnormal; Notable for the following:       Result Value   Potassium 3.2 (*)    Glucose, Bld 131 (*)    Creatinine, Ser 1.35 (*)    Total Protein 8.8 (*)    GFR calc non Af Amer 41 (*)    GFR calc Af Amer 48 (*)    All other components within normal limits  CBC - Abnormal; Notable for the following:    WBC 11.6 (*)    Hemoglobin 11.0 (*)    HCT 32.5 (*)    All other components within normal limits  LIPASE, BLOOD  URINALYSIS, ROUTINE W REFLEX MICROSCOPIC  DIFFERENTIAL   Procedures Procedures (including critical care time)  Medications Ordered in ED Medications  potassium chloride SA (K-DUR,KLOR-CON)  CR tablet 40 mEq (not administered)  ondansetron (ZOFRAN) injection 4 mg (4 mg Intravenous Given 10/09/16 0322)     Initial Impression / Assessment and Plan / ED Course  I have reviewed the triage vital signs and the nursing notes.  Pertinent labs & imaging results that were available during my care of the patient were reviewed by me and considered in my medical decision making (see chart for details).  Abdominal bloating of uncertain cause. Old records are reviewed, and she has been treated for gastroesophageal reflux in the past. She's given ondansetron for nausea. Screening labs are obtained showing mild renal insufficiency which is unchanged from baseline, mild anemia which is unchanged from baseline, and mild hypokalemia. She's given a dose of oral potassium. She feels better with following above-noted  treatment. She is on a diuretic so will need ongoing potassium supplementation is discharged with prescription for K Dur. Follow-up with PCP.  Final Clinical Impressions(s) / ED Diagnoses   Final diagnoses:  Abdominal bloating  Hypokalemia  Renal insufficiency  Normochromic normocytic anemia    New Prescriptions Discharge Medication List as of 10/09/2016  3:54 AM    START taking these medications   Details  potassium chloride SA (K-DUR,KLOR-CON) 20 MEQ tablet Take 1 tablet (20 mEq total) by mouth 2 (two) times daily., Starting Thu 10/09/2016, Print       I personally performed the services described in this documentation, which was scribed in my presence. The recorded information has been reviewed and is accurate.       Dione Boozeavid Avrohom Mckelvin, MD 10/20/16 [phone]

## 2016-10-11 ENCOUNTER — Other Ambulatory Visit: Payer: Self-pay | Admitting: Family Medicine

## 2016-10-14 ENCOUNTER — Encounter: Payer: Self-pay | Admitting: Family Medicine

## 2016-10-14 ENCOUNTER — Ambulatory Visit (INDEPENDENT_AMBULATORY_CARE_PROVIDER_SITE_OTHER): Payer: Medicare Other | Admitting: Family Medicine

## 2016-10-14 VITALS — BP 118/60 | HR 68 | Temp 98.3°F | Wt 243.0 lb

## 2016-10-14 DIAGNOSIS — I10 Essential (primary) hypertension: Secondary | ICD-10-CM

## 2016-10-14 DIAGNOSIS — E039 Hypothyroidism, unspecified: Secondary | ICD-10-CM

## 2016-10-14 MED ORDER — RANITIDINE HCL 150 MG PO TABS: 150.0000 mg | ORAL_TABLET | Freq: Two times a day (BID) | ORAL | 11 refills | Status: DC

## 2016-10-14 NOTE — Assessment & Plan Note (Signed)
Discussed longterm risks of PPI use. Will switch from omeprazole to ranitidine.

## 2016-10-14 NOTE — Assessment & Plan Note (Signed)
Check TSH today. Continue levothyrozine daily.

## 2016-10-14 NOTE — Patient Instructions (Signed)
Use the ranitidine for your reflex.  We will check your thyroid levels today.  Come back to see Dr Nadine CountsGottschalk soon.  Take care,  Dr Jimmey RalphParker

## 2016-10-14 NOTE — Progress Notes (Signed)
   Subjective:  Gwendolyn Hicks is a 61 y.o. female who [facility] with a chief complaint of hypothyroidism follow up.   HPI:  Hypothyroidism Patient currently on synthroid 125mcg daily. She is tolerating this without side effects. No diarrhea or constipation. No palpitations.   GERD Chronic problem. Well controlled on omeprazole for the past several years.   ROS: Per HPI  PMH: Smoking history reviewed.   Objective:  Physical Exam: BP 118/60   Pulse 68   Temp 98.3 F (36.8 C) (Oral)   Wt 243 lb (110.2 kg)   SpO2 99%   BMI 41.71 kg/m   Gen: NAD, resting comfortably CV: RRR with no murmurs appreciated Pulm: NWOB, CTAB with no crackles, wheezes, or rhonchi GI: Normal bowel sounds present. Soft, Nontender, Nondistended. MSK: no edema, cyanosis, or clubbing noted Skin: warm, dry Neuro: grossly normal, moves all extremities Psych: Normal affect and thought content  Assessment/Plan:  Hypothyroidism Check TSH today. Continue levothyrozine 125mcg daily.   HYPERTENSION, BENIGN SYSTEMIC Discussed longterm risks of PPI use. Will switch from omeprazole to ranitidine.   Healthcare Maintenance Patient due for return screenings. Asked her to follow up with PCP for this.   Gwendolyn Degreealeb M. Jimmey RalphParker, MD [facility] Resident PGY-3 10/14/2016 5:11 PM

## 2016-10-16 ENCOUNTER — Telehealth: Payer: Self-pay | Admitting: Family Medicine

## 2016-10-16 ENCOUNTER — Other Ambulatory Visit: Payer: Self-pay | Admitting: Family Medicine

## 2016-10-16 DIAGNOSIS — E039 Hypothyroidism, unspecified: Secondary | ICD-10-CM | POA: Diagnosis not present

## 2016-10-16 NOTE — Telephone Encounter (Signed)
Patient dropped off form to be filled out for Disability parking placard  Please follow up with patient

## 2016-10-17 NOTE — Telephone Encounter (Signed)
Please have patient schedule an appointment with me for completion of this.

## 2016-10-17 NOTE — Telephone Encounter (Signed)
Clinical info completed on 10/17/2016, placed in Dr. Delynn FlavinGottschalk's box for completion.  Gwendolyn SpillersSharon T Isyss Espinal, CMA

## 2016-10-23 ENCOUNTER — Encounter: Payer: Self-pay | Admitting: Family Medicine

## 2016-10-24 NOTE — Telephone Encounter (Signed)
LVM for pt to call. If pt calls, please schedule appt to have form filled out. Sunday SpillersSharon T Analicia Skibinski, CMA

## 2016-10-31 ENCOUNTER — Telehealth: Payer: Self-pay | Admitting: Family Medicine

## 2016-10-31 NOTE — Telephone Encounter (Signed)
Contacted pt and told her that she could try Tylenol cold and sinus and Afrin to help with the drainage. Lamonte SakaiZimmerman Rumple, Aiman Noe D, [place]

## 2016-10-31 NOTE — Telephone Encounter (Signed)
Sinus are draining into her throat. She would like know what she can take to stop the drainage. Please advise

## 2016-11-06 LAB — TSH: TSH: 1.8 u[IU]/mL (ref 0.450–4.500)

## 2016-11-20 ENCOUNTER — Other Ambulatory Visit: Payer: Self-pay | Admitting: Family Medicine

## 2016-11-20 NOTE — Telephone Encounter (Signed)
pt calling to request refill of:  Name of Medication(s):  Potassium Last date of OV:  **10-24-16* Pharmacy: [facility] [address]  Will route refill request to Clinic RN.  Discussed with patient policy to call pharmacy for future refills.  Also, discussed refills may take up to 48 hours to approve or deny.  Avanell ShackletonHarriet C Shelton

## 2016-11-20 NOTE — Telephone Encounter (Signed)
Patient made an appointment for 11-25-16 and would like to know if she can get a script for enough medication to last her until then. Devine Dant,CMA

## 2016-11-20 NOTE — Telephone Encounter (Signed)
Please have patient schedule an appointment with me for these medications.  I will be happy to see her in same day clinic tomorrow.  She needs repeat blood labs prior to continuing potassium, as she has renal disease.  It is not safe to continue this medication without monitoring her labs/ symptoms.

## 2016-11-21 NOTE — Telephone Encounter (Signed)
Please have her come in for labs if she thinks she needs this.  The potassium was given as a one time medication in the ED for hypokalemia.  It was not intended to be a long term medication.

## 2016-11-24 NOTE — Telephone Encounter (Signed)
PT has appointment scheduled on 11/25/16 to discuss this. Lamonte SakaiZimmerman Rumple, Andy Moye D, [place]

## 2016-11-25 ENCOUNTER — Encounter: Payer: Self-pay | Admitting: Family Medicine

## 2016-11-25 ENCOUNTER — Ambulatory Visit (INDEPENDENT_AMBULATORY_CARE_PROVIDER_SITE_OTHER): Payer: Medicare Other | Admitting: Family Medicine

## 2016-11-25 VITALS — BP 130/68 | HR 64 | Temp 98.4°F | Ht 64.0 in | Wt 239.8 lb

## 2016-11-25 DIAGNOSIS — G8929 Other chronic pain: Secondary | ICD-10-CM

## 2016-11-25 DIAGNOSIS — M25562 Pain in left knee: Secondary | ICD-10-CM | POA: Diagnosis not present

## 2016-11-25 DIAGNOSIS — M25561 Pain in right knee: Secondary | ICD-10-CM

## 2016-11-25 DIAGNOSIS — E876 Hypokalemia: Secondary | ICD-10-CM

## 2016-11-25 NOTE — Patient Instructions (Signed)
I will contact you will the results of your labs.  If anything is abnormal, I will call you.  Otherwise, expect a copy to be mailed to you.  Hypokalemia Hypokalemia means that the amount of potassium in the blood is lower than normal.Potassium is a chemical that helps regulate the amount of fluid in the body (electrolyte). It also stimulates muscle tightening (contraction) and helps nerves work properly.Normally, most of the body's potassium is inside of cells, and only a very small amount is in the blood. Because the amount in the blood is so small, minor changes to potassium levels in the blood can be life-threatening. What are the causes? This condition may be caused by:  Antibiotic medicine.  Diarrhea or vomiting. Taking too much of a medicine that helps you have a bowel movement (laxative) can cause diarrhea and lead to hypokalemia.  Chronic kidney disease (CKD).  Medicines that help the body get rid of excess fluid (diuretics).  Eating disorders, such as bulimia.  Low magnesium levels in the body.  Sweating a lot. What are the signs or symptoms? Symptoms of this condition include:  Weakness.  Constipation.  Fatigue.  Muscle cramps.  Mental confusion.  Skipped heartbeats or irregular heartbeat (palpitations).  Tingling or numbness. How is this diagnosed? This condition is diagnosed with a blood test. How is this treated? Hypokalemia can be treated by taking potassium supplements by mouth or adjusting the medicines that you take. Treatment may also include eating more foods that contain a lot of potassium. If your potassium level is very low, you may need to get potassium through an IV tube in one of your veins and be monitored in the hospital. Follow these instructions at home:  Take over-the-counter and prescription medicines only as told by your health care provider. This includes vitamins and supplements.  Eat a healthy diet. A healthy diet includes fresh fruits  and vegetables, whole grains, healthy fats, and lean proteins.  If instructed, eat more foods that contain a lot of potassium, such as:  Nuts, such as peanuts and pistachios.  Seeds, such as sunflower seeds and pumpkin seeds.  Peas, lentils, and lima beans.  Whole grain and bran cereals and breads.  Fresh fruits and vegetables, such as apricots, avocado, bananas, cantaloupe, kiwi, oranges, tomatoes, asparagus, and potatoes.  Orange juice.  Tomato juice.  Red meats.  Yogurt.  Keep all follow-up visits as told by your health care provider. This is important. Contact a health care provider if:  You have weakness that gets worse.  You feel your heart pounding or racing.  You vomit.  You have diarrhea.  You have diabetes (diabetes mellitus) and you have trouble keeping your blood sugar (glucose) in your target range. Get help right away if:  You have chest pain.  You have shortness of breath.  You have vomiting or diarrhea that lasts for more than 2 days.  You faint. This information is not intended to replace advice given to you by your health care provider. Make sure you discuss any questions you have with your health care provider. Document Released: 08/25/2005 Document Revised: 04/12/2016 Document Reviewed: 04/12/2016 Elsevier Interactive Patient Education  2017 [employer]

## 2016-11-25 NOTE — Progress Notes (Signed)
   Subjective: CC: hypokalemia VWU:[id number]:Pinki C Rogelio SeenMcCall is a 61 y.o. female presenting to clinic today for same day appointment. PCP: Delynn FlavinAshly Jeniyah Menor, DO Concerns today include:  1. Hypokalemia Patient seen in ED last month for dizziness and nausea.  She had just lost her husband and reports that she had not been eating well and was not in her right mind.  Today, she denies nausea, dizziness, vomiting, diarrhea, cramping.  She is eating better now.  Voiding normally.  2. Bilateral knee pain Patient has history of bilateral knee pain that was evaluated by orthopedics several years ago.  She notes difficulty ambulating from car to our office.  She reports that she often has to hold on to things when walking longer distances.  She notes that she relied on her husbands disability placard for the car but since he passed away she was told she needed to get her own.  No falls.  She reports she feels like her knee goes in and out of joint intermittently.  No swelling.  +aching in knees.  Allergies  Allergen Reactions  . Diphenhydramine Hcl Itching    Social Hx reviewed: lost husband in december. MedHx, current medications and allergies reviewed.  Please see EMR.  Health Maintenance: mammogram, colonoscopy  ROS: Per HPI  Objective: Office vital signs reviewed. BP 130/68   Pulse 64   Temp 98.4 F (36.9 C) (Oral)   Ht 5\' 4"  (1.626 m)   Wt 239 lb 12.8 oz (108.8 kg)   BMI 41.16 kg/m   Physical Examination:  General: Awake, alert, morbidly obese, No acute distress Cardio: regular rate and rhythm, S1S2 heard Pulm: clear to auscultation bilaterally, no wheezes, rhonchi or rales; normal work of breathing on room air MSK: slow gait and normal station, has limited AROM in extension of knees. No effusion or erythema.  +crepitus.  Assessment/ Plan: 61 y.o. female   1. Hypokalemia. 10/09/16 ED note and labs reviewed.  Is on 25mg  of HCTZ daily.  No other diuretics.  I suspect that poor PO intake  explains her acute hypokalemia.  She has been asymptomatic.  Will obtain repeat BMP to evaluate before continuing oral potassium.   - Basic metabolic panel - Magnesium - Will call with results and make recommendations pending labs  2. Chronic pain of both knees.  Appears to be arthritic in nature.  Could consider knee xrays but ultimately weight loss and mobilization will be key.  We discussed this.  Avoid NSAIDs given CKD.  Tylenol arthritis ok. - Handicap placard filled out today for 5 years. - Follow up prn  Raliegh IpAshly M Ximena Todaro, DO PGY-3, [facility]

## 2016-11-26 ENCOUNTER — Telehealth: Payer: Self-pay | Admitting: Family Medicine

## 2016-11-26 LAB — BASIC METABOLIC PANEL
BUN/Creatinine Ratio: 13 (ref 12–28)
BUN: 15 mg/dL (ref 8–27)
CALCIUM: 9.8 mg/dL (ref 8.7–10.3)
CO2: 24 mmol/L (ref 18–29)
Chloride: 102 mmol/L (ref 96–106)
Creatinine, Ser: 1.12 mg/dL — ABNORMAL HIGH (ref 0.57–1.00)
GFR calc non Af Amer: 53 mL/min/{1.73_m2} — ABNORMAL LOW (ref >59)
GFR, EST AFRICAN AMERICAN: 61 mL/min/{1.73_m2} (ref >59)
Glucose: 115 mg/dL — ABNORMAL HIGH (ref 65–99)
Potassium: 4.7 mmol/L (ref 3.5–5.2)
Sodium: 140 mmol/L (ref 134–144)

## 2016-11-26 LAB — MAGNESIUM: Magnesium: 2.2 mg/dL (ref 1.6–2.3)

## 2016-11-26 NOTE — Telephone Encounter (Signed)
Called and reviewed BMP.  Normal K.  No need for additional supplementation.  Results for orders placed or performed in visit on 11/25/16 (from the past 24 hour(s))  Basic metabolic panel     Status: Abnormal   Collection Time: 11/25/16 11:41 AM  Result Value Ref Range   Glucose 115 (H) 65 - 99 mg/dL   BUN 15 8 - 27 mg/dL   Creatinine, Ser 1.611.12 (H) 0.57 - 1.00 mg/dL   GFR calc non Af Amer 53 (L) >59 mL/min/1.73   GFR calc Af Amer 61 >59 mL/min/1.73   BUN/Creatinine Ratio 13 12 - 28   Sodium 140 134 - 144 mmol/L   Potassium 4.7 3.5 - 5.2 mmol/L   Chloride 102 96 - 106 mmol/L   CO2 24 18 - 29 mmol/L   Calcium 9.8 8.7 - 10.3 mg/dL   Narrative   Performed at:  [address] - [facility] [place] [address], [place], [place]  [id number] Lab Director: Mila HomerWilliam F Hancock MD, Phone:  [phone]  Magnesium     Status: None   Collection Time: 11/25/16 11:41 AM  Result Value Ref Range   Magnesium 2.2 1.6 - 2.3 mg/dL   Narrative   Performed at:  [address] - [facility] [place] [address], [place], [place]  [id number] Lab Director: Mila HomerWilliam F Hancock MD, Phone:  [phone]   Rozell SearingAshly M. Nadine CountsGottschalk, DO PGY-3, [facility]

## 2016-11-26 NOTE — Telephone Encounter (Signed)
Pt came in for appointment to discuss this on 11/25/16.Gwendolyn SakaiZimmerman Hicks, Gwendolyn Hicks D, [place]

## 2016-12-08 ENCOUNTER — Other Ambulatory Visit: Payer: Self-pay | Admitting: Family Medicine

## 2016-12-12 ENCOUNTER — Other Ambulatory Visit: Payer: Self-pay | Admitting: Family Medicine

## 2016-12-12 MED ORDER — CARVEDILOL 3.125 MG PO TABS: 3.1250 mg | ORAL_TABLET | Freq: Two times a day (BID) | ORAL | 3 refills | Status: DC

## 2016-12-12 NOTE — Telephone Encounter (Signed)
Pt called and would like a refill on her Coreg sent in. She said that the pharmacy has been faxing all week. jw

## 2016-12-12 NOTE — Telephone Encounter (Signed)
This has been done.  It may have been going to Dr Caleb Popp, which may explain things.

## 2017-03-02 ENCOUNTER — Other Ambulatory Visit: Payer: Self-pay | Admitting: *Deleted

## 2017-03-02 MED ORDER — LISINOPRIL-HYDROCHLOROTHIAZIDE 20-12.5 MG PO TABS: 2.0000 | ORAL_TABLET | Freq: Every day | ORAL | 2 refills | Status: DC

## 2017-03-02 NOTE — Telephone Encounter (Signed)
Refill for 90 day supply.  Martin, Tamika L, RN  

## 2017-07-17 ENCOUNTER — Other Ambulatory Visit: Payer: Self-pay | Admitting: Family Medicine

## 2017-08-26 ENCOUNTER — Other Ambulatory Visit: Payer: Self-pay | Admitting: Family Medicine

## 2017-09-04 ENCOUNTER — Telehealth: Payer: Self-pay | Admitting: Family Medicine

## 2017-09-04 DIAGNOSIS — I10 Essential (primary) hypertension: Secondary | ICD-10-CM

## 2017-09-04 MED ORDER — LISINOPRIL-HYDROCHLOROTHIAZIDE 20-12.5 MG PO TABS: 2.0000 | ORAL_TABLET | Freq: Every day | ORAL | 2 refills | Status: DC

## 2017-09-04 MED ORDER — CARVEDILOL 3.125 MG PO TABS: 3.1250 mg | ORAL_TABLET | Freq: Two times a day (BID) | ORAL | 3 refills | Status: DC

## 2017-09-04 NOTE — Telephone Encounter (Signed)
Received patient call documentation that patient needed refill of chronic HTN meds.  Refilled.

## 2017-09-04 NOTE — Telephone Encounter (Signed)
Patient only has a few of her BP meds left.  [facility] [address]

## 2017-10-06 ENCOUNTER — Other Ambulatory Visit: Payer: Self-pay | Admitting: Family Medicine

## 2017-10-07 ENCOUNTER — Other Ambulatory Visit: Payer: Self-pay | Admitting: Family Medicine

## 2017-10-08 ENCOUNTER — Other Ambulatory Visit: Payer: Self-pay | Admitting: Family Medicine

## 2017-10-08 MED ORDER — LEVOTHYROXINE SODIUM 125 MCG PO TABS: 125.0000 ug | ORAL_TABLET | Freq: Every morning | ORAL | 11 refills | Status: DC

## 2017-10-08 NOTE — Telephone Encounter (Signed)
It was being sent to previous MD.  Will forward to new MD. Yanky Vanderburg, Maryjo RochesterJessica Dawn, CMA

## 2017-10-08 NOTE — Telephone Encounter (Signed)
Pt needs a refill on her thyroid medication. She said her pharmacy has sent over a few requests for it. Im not sure what her thyroid med is, but they may have been sending it to the old dr.

## 2017-11-04 ENCOUNTER — Other Ambulatory Visit: Payer: Self-pay | Admitting: Family Medicine

## 2017-11-11 ENCOUNTER — Other Ambulatory Visit: Payer: Self-pay | Admitting: *Deleted

## 2017-11-11 MED ORDER — RANITIDINE HCL 150 MG PO TABS: 150.0000 mg | ORAL_TABLET | Freq: Two times a day (BID) | ORAL | 11 refills | Status: DC

## 2017-12-30 ENCOUNTER — Telehealth: Payer: Self-pay

## 2017-12-30 DIAGNOSIS — E039 Hypothyroidism, unspecified: Secondary | ICD-10-CM

## 2017-12-30 MED ORDER — LEVOTHYROXINE SODIUM 125 MCG PO TABS: 125.0000 ug | ORAL_TABLET | Freq: Every morning | ORAL | 11 refills | Status: DC

## 2017-12-30 NOTE — Telephone Encounter (Signed)
Refilling chronic levothyroxin

## 2018-02-16 ENCOUNTER — Encounter: Payer: Self-pay | Admitting: Family Medicine

## 2018-02-16 ENCOUNTER — Other Ambulatory Visit: Payer: Self-pay

## 2018-02-16 ENCOUNTER — Ambulatory Visit (INDEPENDENT_AMBULATORY_CARE_PROVIDER_SITE_OTHER): Payer: Medicare Other | Admitting: Family Medicine

## 2018-02-16 VITALS — BP 130/80 | HR 66 | Temp 98.2°F | Ht 64.0 in | Wt 242.0 lb

## 2018-02-16 DIAGNOSIS — E039 Hypothyroidism, unspecified: Secondary | ICD-10-CM

## 2018-02-16 DIAGNOSIS — I10 Essential (primary) hypertension: Secondary | ICD-10-CM

## 2018-02-16 DIAGNOSIS — R739 Hyperglycemia, unspecified: Secondary | ICD-10-CM | POA: Diagnosis not present

## 2018-02-16 DIAGNOSIS — Z1231 Encounter for screening mammogram for malignant neoplasm of breast: Secondary | ICD-10-CM

## 2018-02-16 DIAGNOSIS — E041 Nontoxic single thyroid nodule: Secondary | ICD-10-CM

## 2018-02-16 DIAGNOSIS — Z1239 Encounter for other screening for malignant neoplasm of breast: Secondary | ICD-10-CM

## 2018-02-16 LAB — POCT GLYCOSYLATED HEMOGLOBIN (HGB A1C): HEMOGLOBIN A1C: 5.1 % (ref 4.0–5.6)

## 2018-02-16 NOTE — Patient Instructions (Signed)
It was a pleasure to see you today! Thank you for choosing [facility] for your primary care. Gwendolyn Hicks was seen for routine check up. Come back to the clinic if you have any new symptoms, and go to the emergency room if you have any life threatening concerns.  Today we drew labs for your thyroid and your potassium.  You seem to be doing well otherwise.  If the labs are normal, we'll mail you.  If there is something we need to discuss I'll call you.   We look forward to seeing you again at your next visit. If you have any questions or concerns before then, please call the clinic at [phone].  Please bring all your medications to every doctors visit  Sign up for My Chart to have easy access to your labs results, and communication with your Primary care physician.    Please check-out at the front desk before leaving the clinic.    Best,  Dr. Marthenia RollingScott Aryanah Enslow FAMILY MEDICINE RESIDENT - PGY1 02/16/2018 4:45 PM

## 2018-02-16 NOTE — Progress Notes (Signed)
    Subjective:  Gwendolyn Hicks is a 62 y.o. female [facility] with a chief complaint of meeting her new pcp as she hasn't been to the clinic in quite some time.   HPI: She denies any new symptoms or significant pain.  She say she really only came in because we asked her to do so.  She agreed to allow checking a few labs if I wanted but did not have any specific requests herself.  She thinks she gets around pretty well and does very well managing her medicines on her own.  She did consent to mammogram referral.  Objective:  Physical Exam: BP 130/80 (BP Location: Right Arm, Patient Position: Sitting, Cuff Size: Large)   Pulse 66   Temp 98.2 F (36.8 C) (Oral)   Ht 5\' 4"  (1.626 m)   Wt 109.8 kg (242 lb)   SpO2 99%   BMI 41.54 kg/m   Gen: NAD, resting comfortably, obese, pleasant CV: RRR with no murmurs appreciated Pulm: NWOB, CTAB with no crackles, wheezes, or rhonchi MSK: no edema, cyanosis, or clubbing noted Skin: warm, dry Neuro: grossly normal, moves all extremities Psych: Normal affect and thought content  No results found for this or any previous visit (from the past 72 hour(s)).   Assessment/Plan:  HYPERTENSION, BENIGN SYSTEMIC Well managed on lisinopril-hctz and carvedilol.   Ordered BMP (normal)  Hyperglycemia Prior history hyperglycemia.  Ordered AiC (5.1)  Thyroid nodule As part of working up hypothyroidism, noted that she had not completed followup with Gen surg from 2013/2014 thyroid nodule evaluation.  Will order tyroid US as recommended by Gen surg in 2014  Breast screening Routine mammorgram ordered, no complaints  Hypothyroidism Taking long term levothyroxin.  TSH was low (0.025), plan is to titrate down to 112.5 and recheck TSH in ~6wks.  Patient called and notified about Rx change and to schedule appt   Marthenia RollingScott Jireh Elmore, DO FAMILY MEDICINE RESIDENT - PGY1 02/20/2018 2:56 PM

## 2018-02-17 ENCOUNTER — Other Ambulatory Visit: Payer: Self-pay | Admitting: Family Medicine

## 2018-02-17 DIAGNOSIS — E039 Hypothyroidism, unspecified: Secondary | ICD-10-CM

## 2018-02-17 DIAGNOSIS — E041 Nontoxic single thyroid nodule: Secondary | ICD-10-CM

## 2018-02-17 LAB — BASIC METABOLIC PANEL
BUN / CREAT RATIO: 13 (ref 12–28)
BUN: 19 mg/dL (ref 8–27)
CHLORIDE: 104 mmol/L (ref 96–106)
CO2: 22 mmol/L (ref 20–29)
Calcium: 9.8 mg/dL (ref 8.7–10.3)
Creatinine, Ser: 1.48 mg/dL — ABNORMAL HIGH (ref 0.57–1.00)
GFR calc Af Amer: 43 mL/min/{1.73_m2} — ABNORMAL LOW (ref >59)
GFR calc non Af Amer: 38 mL/min/{1.73_m2} — ABNORMAL LOW (ref >59)
GLUCOSE: 105 mg/dL — AB (ref 65–99)
POTASSIUM: 4.8 mmol/L (ref 3.5–5.2)
SODIUM: 140 mmol/L (ref 134–144)

## 2018-02-17 LAB — TSH: TSH: 0.025 u[IU]/mL — AB (ref 0.450–4.500)

## 2018-02-17 NOTE — Progress Notes (Signed)
Add free T4 to lab draw from prior visit due to low TSH.  Robert in lab is being notified and assisting.  -Dr. Parke SimmersBland

## 2018-02-18 NOTE — Addendum Note (Signed)
Addended by: Jennette BillBUSICK, Khyleigh Furney L on: 02/18/2018 08:51 AM   Modules accepted: Orders

## 2018-02-19 LAB — SPECIMEN STATUS REPORT

## 2018-02-19 LAB — T4, FREE: Free T4: 1.92 ng/dL — ABNORMAL HIGH (ref 0.82–1.77)

## 2018-02-20 MED ORDER — LEVOTHYROXINE SODIUM 112 MCG PO TABS: 112.5000 ug | ORAL_TABLET | Freq: Every morning | ORAL | 0 refills | Status: DC

## 2018-02-20 NOTE — Assessment & Plan Note (Signed)
As part of working up hypothyroidism, noted that she had not completed followup with Gen surg from 2013/2014 thyroid nodule evaluation.  Will order tyroid US as recommended by Gen surg in 2014

## 2018-02-20 NOTE — Assessment & Plan Note (Signed)
Prior history hyperglycemia.  Ordered AiC (5.1)

## 2018-02-20 NOTE — Assessment & Plan Note (Signed)
Routine mammorgram ordered, no complaints

## 2018-02-20 NOTE — Assessment & Plan Note (Signed)
Well managed on lisinopril-hctz and carvedilol.   Ordered BMP (normal)

## 2018-02-20 NOTE — Assessment & Plan Note (Addendum)
Taking long term levothyroxin.  TSH was low (0.025), plan is to titrate down to 112.5 and recheck TSH in ~6wks.  Patient called and notified about Rx change and to schedule appt

## 2018-03-03 ENCOUNTER — Other Ambulatory Visit: Payer: Self-pay

## 2018-03-03 NOTE — Patient Outreach (Signed)
[facility] Care Management  03/03/2018  Debria GarretMary C Christina [birth year] [id number]   Medication Adherence call to Mrs. Rick DuffMary Castoro left a message for patient to call back patient is due on Lisinopril /Hctz 20/12.5 mg.[facility] said [facility] not [facility] since May/2019.Mrs. Rogelio SeenMccall is showing past due under [employer].  Lillia AbedAna Ollison-Moran CPhT Pharmacy Technician [facility] Care Management Direct Dial [phone]  Fax [phone] [email or web address]

## 2018-03-12 ENCOUNTER — Inpatient Hospital Stay: Admission: RE | Admit: 2018-03-12 | Payer: Medicare Other | Source: Ambulatory Visit

## 2018-03-12 ENCOUNTER — Other Ambulatory Visit: Payer: Self-pay | Admitting: Family Medicine

## 2018-03-12 DIAGNOSIS — Z1231 Encounter for screening mammogram for malignant neoplasm of breast: Secondary | ICD-10-CM

## 2018-03-22 ENCOUNTER — Ambulatory Visit
Admission: RE | Admit: 2018-03-22 | Discharge: 2018-03-22 | Disposition: A | Discharge status: Home / Self Care | Payer: Medicare Other | Source: Ambulatory Visit | Attending: Family Medicine | Admitting: Family Medicine

## 2018-03-22 DIAGNOSIS — E041 Nontoxic single thyroid nodule: Secondary | ICD-10-CM

## 2018-03-22 DIAGNOSIS — E042 Nontoxic multinodular goiter: Secondary | ICD-10-CM | POA: Diagnosis not present

## 2018-04-16 ENCOUNTER — Other Ambulatory Visit: Payer: Self-pay | Admitting: Family Medicine

## 2018-04-16 DIAGNOSIS — I10 Essential (primary) hypertension: Secondary | ICD-10-CM

## 2018-04-16 DIAGNOSIS — E039 Hypothyroidism, unspecified: Secondary | ICD-10-CM

## 2018-08-02 ENCOUNTER — Telehealth: Payer: Self-pay

## 2018-08-02 NOTE — Telephone Encounter (Signed)
Needs alternative sent for Ranitidine.  Ples SpecterAlisa Florida Nolton, RN [facility] RN)

## 2018-08-03 MED ORDER — LORATADINE 10 MG PO TABS: 10.0000 mg | ORAL_TABLET | Freq: Every day | ORAL | 11 refills | Status: DC | PRN

## 2018-08-03 NOTE — Telephone Encounter (Signed)
Cancelling ranitidine, rx'd loratadine  -Dr. Parke SimmersBland

## 2018-08-04 ENCOUNTER — Other Ambulatory Visit: Payer: Self-pay | Admitting: Family Medicine

## 2018-08-04 DIAGNOSIS — I10 Essential (primary) hypertension: Secondary | ICD-10-CM

## 2018-08-10 ENCOUNTER — Telehealth: Payer: Self-pay | Admitting: Family Medicine

## 2018-08-10 NOTE — Telephone Encounter (Signed)
Attempted to contact pt to discuss overdue colonoscopy and their options; unable to reach. -CH

## 2018-09-15 ENCOUNTER — Other Ambulatory Visit: Payer: Self-pay

## 2018-09-15 DIAGNOSIS — I10 Essential (primary) hypertension: Secondary | ICD-10-CM

## 2018-09-15 NOTE — Patient Outreach (Signed)
[facility]) Care Management  09/15/2018  RICKI SPAUDE Apr 20, 1956 [id number]   Medication Adherence call to Mrs. Rick Duff spoke with patient she is due on Lisinopril/HCTZ 20/12.5 mg patient only has two tablet and ask if we can call [facility] an order this medication. Patient will pick up when [facility] lets her know is ready. [facility] said patient only have a 60 days supply but will call doctor's office for a 90 days supply.    Lillia Abed CPhT Pharmacy Technician [facility] Care Management Direct Dial [phone]  Fax [phone] [email or web address]

## 2018-09-16 MED ORDER — LISINOPRIL-HYDROCHLOROTHIAZIDE 20-12.5 MG PO TABS: 2.0000 | ORAL_TABLET | Freq: Every day | ORAL | 1 refills | Status: DC

## 2018-11-30 ENCOUNTER — Telehealth: Payer: Self-pay | Admitting: Family Medicine

## 2018-11-30 NOTE — Telephone Encounter (Signed)
Please advise pt called [facility] and stating she needs something called in for her acid reflux since she was taken off her Zantac. According to phone message from 08/02/18 she was switched from Zantac to  Loratadine (claritin).

## 2018-12-01 ENCOUNTER — Encounter: Payer: Self-pay | Admitting: Family Medicine

## 2018-12-01 NOTE — Telephone Encounter (Signed)
Patient called requesting a new rx for her acid reflux.  She was taking zantac. This encounter was created in error - please disregard.

## 2018-12-04 ENCOUNTER — Other Ambulatory Visit: Payer: Self-pay | Admitting: Family Medicine

## 2018-12-04 DIAGNOSIS — K219 Gastro-esophageal reflux disease without esophagitis: Secondary | ICD-10-CM

## 2018-12-04 MED ORDER — FAMOTIDINE 10 MG PO TABS: 10.0000 mg | ORAL_TABLET | Freq: Two times a day (BID) | ORAL | 0 refills | Status: DC

## 2019-03-03 ENCOUNTER — Other Ambulatory Visit: Payer: Self-pay

## 2019-03-03 DIAGNOSIS — K219 Gastro-esophageal reflux disease without esophagitis: Secondary | ICD-10-CM

## 2019-03-03 MED ORDER — FAMOTIDINE 10 MG PO TABS: 10.0000 mg | ORAL_TABLET | Freq: Two times a day (BID) | ORAL | 0 refills | Status: DC

## 2019-03-07 ENCOUNTER — Ambulatory Visit (INDEPENDENT_AMBULATORY_CARE_PROVIDER_SITE_OTHER): Payer: Medicare Other | Admitting: Family Medicine

## 2019-03-07 ENCOUNTER — Other Ambulatory Visit: Payer: Self-pay

## 2019-03-07 ENCOUNTER — Encounter: Payer: Self-pay | Admitting: Family Medicine

## 2019-03-07 VITALS — BP 128/78 | HR 85

## 2019-03-07 DIAGNOSIS — H5462 Unqualified visual loss, left eye, normal vision right eye: Secondary | ICD-10-CM | POA: Diagnosis not present

## 2019-03-07 DIAGNOSIS — K219 Gastro-esophageal reflux disease without esophagitis: Secondary | ICD-10-CM | POA: Diagnosis not present

## 2019-03-07 DIAGNOSIS — E039 Hypothyroidism, unspecified: Secondary | ICD-10-CM | POA: Diagnosis not present

## 2019-03-07 MED ORDER — LEVOTHYROXINE SODIUM 100 MCG PO TABS: 100.0000 ug | ORAL_TABLET | Freq: Every day | ORAL | 3 refills | Status: DC

## 2019-03-07 NOTE — Patient Instructions (Addendum)
[facility] for the acid medicine. They should have it, and it should help you.    For your thyroid, we will decrease the dose to 161mcg.   Please don't take that immune supplement, I think it may make your thyroid medicine less effective.   Expect the eye doctor to call you within a week.

## 2019-03-07 NOTE — Progress Notes (Signed)
   CC: GERD,. Thyroid   HPI  She has acid reflux - PPI helped but she thinks she was taken off of it due to being off the market. So instead she has been taking OTC omeprazole. This isn't helping. Has had reflux for many years. sxs include epigastric pain and burping. Better when she stays away from fried foods. tums also helps. Apparently [facility] doesn't have the pepcid.   Thyroid - says her pills used to be purple and she was doing well. She stopped taking these Friday because she was having headaches and feels the dose is too high.   Had eye itching in the L eye and then sudden vision loss in the L eye back in march. No eval. No rash on her face or injury before this.   ROS: Denies CP, SOB, abdominal pain, dysuria, changes in BMs.   CC, SH/smoking status, and VS noted  Objective: BP 128/78   Pulse 85   SpO2 99%  Gen: NAD, alert, cooperative, and pleasant. HEENT: NCAT, EOMI CV: RRR, no murmur Resp: CTAB, no wheezes, non-labored Ext: No edema, warm Neuro: Alert and oriented, Speech clear, No gross deficits  Assessment and plan:  Hypothyroidism Patient's formulation of levothyroxine was changed by the pharmacy recently, this should not have been done without notifying the patient as this is the narrow therapeutic index medication. Given that she has been taking this intermittently over the last week, there would be little utility in checking a TSH today.  She feels like this dose is overall too high, which may be true.  As such, we will just change to 100 mcg daily and recheck TSH in 6 to 8 weeks.  I have a low suspicion that this decrease in dose would under replace her as she was over replaced at last check.  GASTROESOPHAGEAL REFLUX, NO ESOPHAGITIS Was not able to get pepcid when changed from zantac. She was taking OTC PPI. Asked her to try to get pepcid instead, pharmacy may have been temporarily out.   Sudden vision loss 2 months ago - etiology of left sudden vision loss is  unclear, and I am not sure that we can recover this.  However, she states she has some blurriness in the right eye and I wonder if there is a pathology going on with both that could be intervened upon. placed referral to ophthalmology.  Right eye vision is 20/40.    Orders Placed This Encounter  Procedures  . Ambulatory referral to Ophthalmology    Referral Priority:   Routine    Referral Type:   Consultation    Referral Reason:   Specialty Services Required    Requested Specialty:   Ophthalmology    Number of Visits Requested:   1    Meds ordered this encounter  Medications  . levothyroxine (SYNTHROID) 100 MCG tablet    Sig: Take 1 tablet (100 mcg total) by mouth daily.    Dispense:  90 tablet    Refill:  3    Ralene Ok, MD, PGY3 03/08/2019 8:52 AM

## 2019-03-08 NOTE — Assessment & Plan Note (Signed)
Patient's formulation of levothyroxine was changed by the pharmacy recently, this should not have been done without notifying the patient as this is the narrow therapeutic index medication. Given that she has been taking this intermittently over the last week, there would be little utility in checking a TSH today.  She feels like this dose is overall too high, which may be true.  As such, we will just change to 100 mcg daily and recheck TSH in 6 to 8 weeks.  I have a low suspicion that this decrease in dose would under replace her as she was over replaced at last check.

## 2019-03-08 NOTE — Assessment & Plan Note (Signed)
Was not able to get pepcid when changed from zantac. She was taking OTC PPI. Asked her to try to get pepcid instead, pharmacy may have been temporarily out.

## 2019-03-26 ENCOUNTER — Other Ambulatory Visit: Payer: Self-pay

## 2019-03-26 ENCOUNTER — Encounter (HOSPITAL_COMMUNITY): Payer: Self-pay

## 2019-03-26 ENCOUNTER — Inpatient Hospital Stay (HOSPITAL_COMMUNITY)
Admission: EM | Admit: 2019-03-26 | Discharge: 2019-03-29 | DRG: 178 | Disposition: A | Discharge status: Home Health | Payer: Medicare Other | Attending: Internal Medicine | Admitting: Internal Medicine

## 2019-03-26 ENCOUNTER — Emergency Department (HOSPITAL_COMMUNITY): Payer: Medicare Other

## 2019-03-26 DIAGNOSIS — I129 Hypertensive chronic kidney disease with stage 1 through stage 4 chronic kidney disease, or unspecified chronic kidney disease: Secondary | ICD-10-CM | POA: Diagnosis present

## 2019-03-26 DIAGNOSIS — Z79890 Hormone replacement therapy (postmenopausal): Secondary | ICD-10-CM

## 2019-03-26 DIAGNOSIS — K219 Gastro-esophageal reflux disease without esophagitis: Secondary | ICD-10-CM | POA: Diagnosis not present

## 2019-03-26 DIAGNOSIS — R509 Fever, unspecified: Secondary | ICD-10-CM

## 2019-03-26 DIAGNOSIS — R112 Nausea with vomiting, unspecified: Secondary | ICD-10-CM | POA: Diagnosis not present

## 2019-03-26 DIAGNOSIS — Z79899 Other long term (current) drug therapy: Secondary | ICD-10-CM

## 2019-03-26 DIAGNOSIS — N179 Acute kidney failure, unspecified: Secondary | ICD-10-CM | POA: Diagnosis present

## 2019-03-26 DIAGNOSIS — D696 Thrombocytopenia, unspecified: Secondary | ICD-10-CM | POA: Diagnosis not present

## 2019-03-26 DIAGNOSIS — I10 Essential (primary) hypertension: Secondary | ICD-10-CM | POA: Diagnosis not present

## 2019-03-26 DIAGNOSIS — E669 Obesity, unspecified: Secondary | ICD-10-CM | POA: Diagnosis present

## 2019-03-26 DIAGNOSIS — R0602 Shortness of breath: Secondary | ICD-10-CM

## 2019-03-26 DIAGNOSIS — R531 Weakness: Secondary | ICD-10-CM

## 2019-03-26 DIAGNOSIS — U071 COVID-19: Principal | ICD-10-CM | POA: Diagnosis present

## 2019-03-26 DIAGNOSIS — I959 Hypotension, unspecified: Secondary | ICD-10-CM | POA: Diagnosis present

## 2019-03-26 DIAGNOSIS — Z7982 Long term (current) use of aspirin: Secondary | ICD-10-CM | POA: Diagnosis not present

## 2019-03-26 DIAGNOSIS — N183 Chronic kidney disease, stage 3 (moderate): Secondary | ICD-10-CM | POA: Diagnosis present

## 2019-03-26 DIAGNOSIS — E039 Hypothyroidism, unspecified: Secondary | ICD-10-CM | POA: Diagnosis not present

## 2019-03-26 DIAGNOSIS — F329 Major depressive disorder, single episode, unspecified: Secondary | ICD-10-CM | POA: Diagnosis present

## 2019-03-26 DIAGNOSIS — Z8249 Family history of ischemic heart disease and other diseases of the circulatory system: Secondary | ICD-10-CM

## 2019-03-26 DIAGNOSIS — Z6839 Body mass index (BMI) 39.0-39.9, adult: Secondary | ICD-10-CM | POA: Diagnosis not present

## 2019-03-26 DIAGNOSIS — R609 Edema, unspecified: Secondary | ICD-10-CM | POA: Diagnosis not present

## 2019-03-26 DIAGNOSIS — Z888 Allergy status to other drugs, medicaments and biological substances status: Secondary | ICD-10-CM | POA: Diagnosis not present

## 2019-03-26 DIAGNOSIS — R109 Unspecified abdominal pain: Secondary | ICD-10-CM | POA: Diagnosis not present

## 2019-03-26 LAB — URINALYSIS, ROUTINE W REFLEX MICROSCOPIC
Bilirubin Urine: NEGATIVE
Glucose, UA: NEGATIVE mg/dL
Ketones, ur: NEGATIVE mg/dL
Nitrite: NEGATIVE
Protein, ur: NEGATIVE mg/dL
Specific Gravity, Urine: 1.011 (ref 1.005–1.030)
pH: 5 (ref 5.0–8.0)

## 2019-03-26 LAB — CBC WITH DIFFERENTIAL/PLATELET
Abs Immature Granulocytes: 0.02 10*3/uL (ref 0.00–0.07)
Basophils Absolute: 0 10*3/uL (ref 0.0–0.1)
Basophils Relative: 0 %
Eosinophils Absolute: 0 10*3/uL (ref 0.0–0.5)
Eosinophils Relative: 0 %
HCT: 32.3 % — ABNORMAL LOW (ref 36.0–46.0)
Hemoglobin: 11 g/dL — ABNORMAL LOW (ref 12.0–15.0)
Immature Granulocytes: 0 %
Lymphocytes Relative: 21 %
Lymphs Abs: 1.1 10*3/uL (ref 0.7–4.0)
MCH: 28.6 pg (ref 26.0–34.0)
MCHC: 34.1 g/dL (ref 30.0–36.0)
MCV: 84.1 fL (ref 80.0–100.0)
Monocytes Absolute: 0.6 10*3/uL (ref 0.1–1.0)
Monocytes Relative: 11 %
Neutro Abs: 3.5 10*3/uL (ref 1.7–7.7)
Neutrophils Relative %: 68 %
Platelets: 141 10*3/uL — ABNORMAL LOW (ref 150–400)
RBC: 3.84 MIL/uL — ABNORMAL LOW (ref 3.87–5.11)
RDW: 12.4 % (ref 11.5–15.5)
WBC: 5.1 10*3/uL (ref 4.0–10.5)
nRBC: 0 % (ref 0.0–0.2)

## 2019-03-26 LAB — SARS CORONAVIRUS 2 BY RT PCR (HOSPITAL ORDER, PERFORMED IN ~~LOC~~ HOSPITAL LAB): SARS Coronavirus 2: POSITIVE — AB

## 2019-03-26 LAB — COMPREHENSIVE METABOLIC PANEL
ALT: 19 U/L (ref 0–44)
AST: 31 U/L (ref 15–41)
Albumin: 3.7 g/dL (ref 3.5–5.0)
Alkaline Phosphatase: 51 U/L (ref 38–126)
Anion gap: 13 (ref 5–15)
BUN: 38 mg/dL — ABNORMAL HIGH (ref 8–23)
CO2: 19 mmol/L — ABNORMAL LOW (ref 22–32)
Calcium: 8.7 mg/dL — ABNORMAL LOW (ref 8.9–10.3)
Chloride: 101 mmol/L (ref 98–111)
Creatinine, Ser: 2.54 mg/dL — ABNORMAL HIGH (ref 0.44–1.00)
GFR calc Af Amer: 22 mL/min — ABNORMAL LOW (ref >60)
GFR calc non Af Amer: 19 mL/min — ABNORMAL LOW (ref >60)
Glucose, Bld: 125 mg/dL — ABNORMAL HIGH (ref 70–99)
Potassium: 4 mmol/L (ref 3.5–5.1)
Sodium: 133 mmol/L — ABNORMAL LOW (ref 135–145)
Total Bilirubin: 1.2 mg/dL (ref 0.3–1.2)
Total Protein: 8.1 g/dL (ref 6.5–8.1)

## 2019-03-26 LAB — FERRITIN: Ferritin: 295 ng/mL (ref 11–307)

## 2019-03-26 LAB — D-DIMER, QUANTITATIVE: D-Dimer, Quant: >20 ug/mL-FEU — ABNORMAL HIGH (ref 0.00–0.50)

## 2019-03-26 LAB — LACTATE DEHYDROGENASE: LDH: 195 U/L — ABNORMAL HIGH (ref 98–192)

## 2019-03-26 LAB — FIBRINOGEN: Fibrinogen: 476 mg/dL — ABNORMAL HIGH (ref 210–475)

## 2019-03-26 LAB — PROTIME-INR
INR: 1.1 (ref 0.8–1.2)
Prothrombin Time: 13.9 seconds (ref 11.4–15.2)

## 2019-03-26 LAB — LIPASE, BLOOD: Lipase: 42 U/L (ref 11–51)

## 2019-03-26 LAB — PROCALCITONIN: Procalcitonin: <0.1 ng/mL

## 2019-03-26 LAB — LACTIC ACID, PLASMA: Lactic Acid, Venous: 1.1 mmol/L (ref 0.5–1.9)

## 2019-03-26 LAB — TRIGLYCERIDES: Triglycerides: 83 mg/dL (ref <150)

## 2019-03-26 LAB — C-REACTIVE PROTEIN: CRP: 3.1 mg/dL — ABNORMAL HIGH (ref <1.0)

## 2019-03-26 MED ORDER — LORATADINE 10 MG PO TABS: 10.0000 mg | ORAL_TABLET | Freq: Every day | ORAL | Status: DC | PRN

## 2019-03-26 MED ORDER — ONDANSETRON HCL 4 MG/2ML IJ SOLN: 4.0000 mg | Freq: Four times a day (QID) | INTRAMUSCULAR | Status: DC | PRN

## 2019-03-26 MED ORDER — SODIUM CHLORIDE 0.9 % IV SOLN
INTRAVENOUS | Status: DC
  Administered 2019-03-26 – 2019-03-28 (×3): via INTRAVENOUS

## 2019-03-26 MED ORDER — ONDANSETRON HCL 4 MG/2ML IJ SOLN
4.0000 mg | Freq: Once | INTRAMUSCULAR | Status: AC
  Administered 2019-03-26: 12:00:00 4 mg via INTRAVENOUS
  Filled 2019-03-26: qty 2

## 2019-03-26 MED ORDER — PANTOPRAZOLE SODIUM 40 MG PO TBEC
40.0000 mg | DELAYED_RELEASE_TABLET | Freq: Every day | ORAL | Status: DC
  Administered 2019-03-27 – 2019-03-29 (×3): 40 mg via ORAL
  Filled 2019-03-26 (×3): qty 1

## 2019-03-26 MED ORDER — SODIUM CHLORIDE 0.9 % IV BOLUS
500.0000 mL | Freq: Once | INTRAVENOUS | Status: AC
  Administered 2019-03-26: 500 mL via INTRAVENOUS

## 2019-03-26 MED ORDER — ALBUTEROL SULFATE (2.5 MG/3ML) 0.083% IN NEBU: 2.5000 mg | INHALATION_SOLUTION | RESPIRATORY_TRACT | Status: DC | PRN

## 2019-03-26 MED ORDER — LEVOTHYROXINE SODIUM 50 MCG PO TABS
100.0000 ug | ORAL_TABLET | Freq: Every day | ORAL | Status: DC
  Administered 2019-03-26 – 2019-03-29 (×4): 100 ug via ORAL
  Filled 2019-03-26 (×5): qty 2

## 2019-03-26 MED ORDER — HEPARIN SODIUM (PORCINE) 5000 UNIT/ML IJ SOLN
5000.0000 [IU] | Freq: Three times a day (TID) | INTRAMUSCULAR | Status: DC
  Administered 2019-03-26 – 2019-03-29 (×8): 5000 [IU] via SUBCUTANEOUS
  Filled 2019-03-26 (×8): qty 1

## 2019-03-26 MED ORDER — OMEPRAZOLE MAGNESIUM 20 MG PO TBEC: 20.0000 mg | DELAYED_RELEASE_TABLET | Freq: Two times a day (BID) | ORAL | Status: DC

## 2019-03-26 MED ORDER — ASPIRIN EC 81 MG PO TBEC
81.0000 mg | DELAYED_RELEASE_TABLET | Freq: Every day | ORAL | Status: DC
  Administered 2019-03-26 – 2019-03-29 (×4): 81 mg via ORAL
  Filled 2019-03-26 (×6): qty 1

## 2019-03-26 MED ORDER — CARVEDILOL 3.125 MG PO TABS
3.1250 mg | ORAL_TABLET | Freq: Two times a day (BID) | ORAL | Status: DC
  Administered 2019-03-26 – 2019-03-27 (×3): 3.125 mg via ORAL
  Filled 2019-03-26 (×3): qty 1

## 2019-03-26 MED ORDER — FAMOTIDINE 20 MG PO TABS: 10.0000 mg | ORAL_TABLET | Freq: Two times a day (BID) | ORAL | Status: DC

## 2019-03-26 NOTE — ED Provider Notes (Signed)
[facility]ENC[facility] Provider Note   CSN: [id number] Arrival date & time: 03/26/19  1015     History   Chief Complaint Chief Complaint  Patient presents with  . Abdominal Pain  . n/v    HPI Gwendolyn Hicks is a 63 y.o. female.  She said she has not felt well since 5 days ago.  She is felt generally weak and nauseous although denies any abdominal pain or vomiting.  She was a little constipated and took a laxative and had some soft stool.  No fevers or cough no headache no sore throat no shortness of breath.  She denies any chills or sweats.  No urinary symptoms.  She denies abdominal pain although she says her upper stomach just feels upset.  She lives at home with family no one sick there.     The history is provided by the patient.  Weakness Severity:  Moderate Onset quality:  Gradual Duration:  5 days Timing:  Constant Progression:  Unchanged Chronicity:  New Context: not recent infection   Relieved by:  None tried Worsened by:  Activity Ineffective treatments:  None tried Associated symptoms: nausea and vision change (vision loss left x 1 month)   Associated symptoms: no abdominal pain, no chest pain, no cough, no diarrhea, no dysuria, no fever, no foul-smelling urine, no frequency, no headaches, no loss of consciousness, no melena, no shortness of breath, no stroke symptoms and no vomiting     Past Medical History:  Diagnosis Date  . Anemia   . Depression   . GERD (gastroesophageal reflux disease)   . Hypertension     Patient Active Problem List   Diagnosis Date Noted  . Chronic pain of both knees 11/25/2016  . Systolic murmur 01/25/2016  . Breast screening 09/06/2013  . Thyroid nodule 06/04/2012  . Thyromegaly 05/26/2012  . Hyperglycemia 09/30/2011  . Chronic kidney disease (CKD), stage III (moderate) (HCC) 04/15/2010  . Hypothyroidism 11/05/2006  . OBESITY, NOS 11/05/2006  . HYPERTENSION, BENIGN SYSTEMIC 11/05/2006  .  GASTROESOPHAGEAL REFLUX, NO ESOPHAGITIS 11/05/2006    Past Surgical History:  Procedure Laterality Date  . CESAREAN SECTION     2     OB History   No obstetric history on file.      Home Medications    Prior to Admission medications   Medication Sig Start Date End Date Taking? Authorizing Provider  aspirin (ANACIN) 81 MG EC tablet Take 81 mg by mouth daily.     [provider]  carvedilol (COREG) 3.125 MG tablet TAKE 1 TABLET BY MOUTH TWICE DAILY 04/16/18   Marthenia RollingBland, Scott, DO  famotidine (PEPCID) 10 MG tablet Take 1 tablet (10 mg total) by mouth 2 (two) times daily. 03/03/19 06/01/19  Marthenia RollingBland, Scott, DO  levothyroxine (SYNTHROID) 100 MCG tablet Take 1 tablet (100 mcg total) by mouth daily. 03/07/19   Garth Bignessimberlake, Kathryn, MD  lisinopril-hydrochlorothiazide (PRINZIDE,ZESTORETIC) 20-12.5 MG tablet Take 2 tablets by mouth daily. 09/16/18 03/15/19  Marthenia RollingBland, Scott, DO  loratadine (CLARITIN) 10 MG tablet Take 1 tablet (10 mg total) by mouth daily as needed for allergies. 08/03/18   Bland, Scott, DO  ondansetron (ZOFRAN) 4 MG tablet Take 1 tablet (4 mg total) by mouth every 6 (six) hours as needed for nausea. 10/09/16   Dione BoozeGlick, David, MD    Family History Family History  Problem Relation Age of Onset  . Hypertension Mother   . Heart disease Mother   . Hypertension Father   .  Heart disease Father     Social History Social History   Tobacco Use  . Smoking status: Never Smoker  . Smokeless tobacco: Never Used  Substance Use Topics  . Alcohol use: No  . Drug use: No     Allergies   Diphenhydramine hcl   Review of Systems Review of Systems  Constitutional: Positive for fatigue. Negative for chills and fever.  HENT: Negative for sore throat.   Eyes: Positive for visual disturbance. Negative for pain.  Respiratory: Negative for cough and shortness of breath.   Cardiovascular: Negative for chest pain.  Gastrointestinal: Positive for nausea. Negative for abdominal pain, diarrhea,  melena and vomiting.  Genitourinary: Negative for dysuria and frequency.  Musculoskeletal: Negative for neck pain.  Skin: Negative for rash and wound.  Neurological: Positive for weakness. Negative for loss of consciousness and headaches.     Physical Exam Updated Vital Signs BP 113/63 (BP Location: Right Arm)   Pulse (!) 107   Temp 100.1 F (37.8 C) (Oral)   Resp 14   Ht 5\' 4"  (1.626 m)   Wt 90.7 kg   SpO2 97%   BMI 34.33 kg/m   Physical Exam Vitals signs and nursing note reviewed.  Constitutional:      General: She is not in acute distress.    Appearance: She is well-developed.  HENT:     Head: Normocephalic and atraumatic.  Eyes:     Conjunctiva/sclera: Conjunctivae normal.     Pupils: Pupils are equal, round, and reactive to light.     Comments: She has a cloudy numbness in her left eye likely at the level of the lens.  She is got light perception out of that eye but she said she cannot see anything distinctly.  Neck:     Musculoskeletal: Neck supple.  Cardiovascular:     Rate and Rhythm: Normal rate and regular rhythm.     Heart sounds: No murmur.  Pulmonary:     Effort: Pulmonary effort is normal. No respiratory distress.     Breath sounds: Normal breath sounds.  Abdominal:     Palpations: Abdomen is soft.     Tenderness: There is no abdominal tenderness. There is no guarding or rebound.  Musculoskeletal: Normal range of motion.        General: No tenderness or signs of injury.     Right lower leg: No edema.     Left lower leg: No edema.  Skin:    General: Skin is warm and dry.     Capillary Refill: Capillary refill takes less than 2 seconds.  Neurological:     General: No focal deficit present.     Mental Status: She is alert and oriented to person, place, and time.     Sensory: No sensory deficit.     Motor: No weakness.      ED Treatments / Results  Labs (all labs ordered are listed, but only abnormal results are displayed) Labs Reviewed  SARS  CORONAVIRUS 2 (HOSPITAL ORDER, PERFORMED IN [facility]) - Abnormal; Notable for the following components:      Result Value   SARS Coronavirus 2 POSITIVE (*)    All other components within normal limits  CBC WITH DIFFERENTIAL/PLATELET - Abnormal; Notable for the following components:   RBC 3.84 (*)    Hemoglobin 11.0 (*)    HCT 32.3 (*)    Platelets 141 (*)    All other components within normal limits  COMPREHENSIVE METABOLIC PANEL -  Abnormal; Notable for the following components:   Sodium 133 (*)    CO2 19 (*)    Glucose, Bld 125 (*)    BUN 38 (*)    Creatinine, Ser 2.54 (*)    Calcium 8.7 (*)    GFR calc non Af Amer 19 (*)    GFR calc Af Amer 22 (*)    All other components within normal limits  URINALYSIS, ROUTINE W REFLEX MICROSCOPIC - Abnormal; Notable for the following components:   APPearance HAZY (*)    Hgb urine dipstick SMALL (*)    Leukocytes,Ua TRACE (*)    Bacteria, UA MANY (*)    All other components within normal limits  D-DIMER, QUANTITATIVE (NOT AT [facility]) - Abnormal; Notable for the following components:   D-Dimer, Quant >20.00 (*)    All other components within normal limits  LACTATE DEHYDROGENASE - Abnormal; Notable for the following components:   LDH 195 (*)    All other components within normal limits  FIBRINOGEN - Abnormal; Notable for the following components:   Fibrinogen 476 (*)    All other components within normal limits  C-REACTIVE PROTEIN - Abnormal; Notable for the following components:   CRP 3.1 (*)    All other components within normal limits  CULTURE, BLOOD (ROUTINE X 2)  CULTURE, BLOOD (ROUTINE X 2)  URINE CULTURE  LIPASE, BLOOD  LACTIC ACID, PLASMA  PROTIME-INR  PROCALCITONIN  FERRITIN  TRIGLYCERIDES  LACTIC ACID, PLASMA    EKG EKG Interpretation  Date/Time:  Saturday March 26 2019 10:27:42 EDT Ventricular Rate:  95 PR Interval:    QRS Duration: 89 QT Interval:  348 QTC Calculation: 438 R Axis:   -20 Text  Interpretation:  Sinus rhythm Borderline left axis deviation similar to prior 1/11 Confirmed by Aletta Edouard [phone]) on 03/26/2019 11:23:56 AM   Radiology Dg Chest Port 1 View  Result Date: 03/26/2019 CLINICAL DATA:  63 year old female with abdominal pain, nausea and vomiting EXAM: PORTABLE CHEST 1 VIEW COMPARISON:  Prior chest x-ray 10/08/2009 FINDINGS: The lungs are clear and negative for focal airspace consolidation, pulmonary edema or suspicious pulmonary nodule. No pleural effusion or pneumothorax. Cardiac and mediastinal contours are within normal limits. No acute fracture or lytic or blastic osseous lesions. The visualized upper abdominal bowel gas pattern is unremarkable. IMPRESSION: No active disease. Electronically Signed   By: Jacqulynn Cadet M.D.   On: 03/26/2019 11:11    Procedures Procedures (including critical care time)  Medications Ordered in ED Medications  sodium chloride 0.9 % bolus 500 mL (has no administration in time range)  ondansetron (ZOFRAN) injection 4 mg (has no administration in time range)     Initial Impression / Assessment and Plan / ED Course  I have reviewed the triage vital signs and the nursing notes.  Pertinent labs & imaging results that were available during my care of the patient were reviewed by me and considered in my medical decision making (see chart for details).  Clinical Course as of Mar 26 542  Sat Mar 25, 3456  6738 63 year old female here with generalized weakness and nausea that is been going on for for 5 days.  She is low-grade temperature here of 100.1.  No other infectious symptoms.  She is getting screening labs lactate cultures urinalysis chest x-ray.  Differential includes UTI, intra-abdominal infection, pneumonia, Covid   [MB]  1316 Patient's labs significant for a new AKI.  Her hemoglobin is low (baseline.  Chest x-ray does not show any gross  infiltrates.  Covid testing and urinalysis still pending.   [MB]  1355 Patient's  Covid is come back positive.  I have updated her and put a call into the hospitalist.  I think with her AKI we should probably admit her.  I informed the patient but asked her that she hold off on informing her family until I am sure we can but she is can end up on.   [MB]  1401 Discussed with Dr. Sharyon MedicusHijazi who asked if we could also get the Covid inflammatory markers.  He is planning on admitting her observation for IV hydration and repeat creatinine tomorrow.   [MB]    Clinical Course User Index [MB] Terrilee FilesButler, Michael C, MD        Final Clinical Impressions(s) / ED Diagnoses   Final diagnoses:  COVID-19 virus infection  AKI (acute kidney injury) [facility](HCC)  Generalized weakness    ED Discharge Orders    None       Terrilee FilesButler, Michael C, MD 03/27/19 [phone]

## 2019-03-26 NOTE — ED Notes (Signed)
ED TO INPATIENT HANDOFF REPORT  ED Nurse Name and Phone #: Osborne CascoNadia, RN   S Name/Age/Gender Gwendolyn Hicks 63 y.o. female Room/Bed: 024C/024C  Code Status   Code Status: Not on file  Home/SNF/Other Home Patient oriented to: self, place, time and situation Is this baseline? Yes   Triage Complete: Triage complete  Chief Complaint abd pain   Triage Note Pt arrives POV from home c/o abdominal pain, n/v that started on Monday. Pt reports she "feelings like she needs to vomit but gags." Pt's oral temp was 100.1.   Allergies Allergies  Allergen Reactions  . Diphenhydramine Hcl Itching    Level of Care/Admitting Diagnosis ED Disposition    ED Disposition Condition Comment   Admit  [facility]ospital [facility]: [facility] [[id number]]  Level of Care: Med-Surg [16]  Covid Evaluation: Confirmed COVID Positive  Diagnosis: Acute renal failure (ARF) [facility](HCC) [[id number]]) [[id number]]  Admitting Physician: Carron CurieHIJAZI, ALI Bai.Lain[[id number]]  Attending Physician: Sharyon MedicusHIJAZI, ALI Bai.Lain[[id number]]  Estimated length of stay: past midnight tomorrow  Certification:: I certify this patient will need inpatient services for at least 2 midnights  PT Class (Do Not Modify): Inpatient [101]  PT Acc Code (Do Not Modify): Private [1]       B Medical/Surgery History Past Medical History:  Diagnosis Date  . Anemia   . Depression   . GERD (gastroesophageal reflux disease)   . Hypertension    Past Surgical History:  Procedure Laterality Date  . CESAREAN SECTION     2     A IV Location/Drains/Wounds Patient Lines/Drains/Airways Status   Active Line/Drains/Airways    Name:   Placement date:   Placement time:   Site:   Days:   Peripheral IV 03/26/19 Right Antecubital   03/26/19    1146    Antecubital   less than 1          Intake/Output Last 24 hours  Intake/Output Summary (Last 24 hours) at 03/26/2019 1410 Last data filed at 03/26/2019 1410 Gross per 24 hour  Intake 500 ml  Output -  Net 500 ml    Labs/Imaging Results  for orders placed or performed during the hospital encounter of 03/26/19 (from the past 48 hour(s))  CBC with Differential     Status: Abnormal   Collection Time: 03/26/19 10:51 AM  Result Value Ref Range   WBC 5.1 4.0 - 10.5 K/uL   RBC 3.84 (L) 3.87 - 5.11 MIL/uL   Hemoglobin 11.0 (L) 12.0 - 15.0 g/dL   HCT 91.432.3 (L) 78.236.0 - 95.646.0 %   MCV 84.1 80.0 - 100.0 fL   MCH 28.6 26.0 - 34.0 pg   MCHC 34.1 30.0 - 36.0 g/dL   RDW 21.312.4 08.611.5 - 57.815.5 %   Platelets 141 (L) 150 - 400 K/uL   nRBC 0.0 0.0 - 0.2 %   Neutrophils Relative % 68 %   Neutro Abs 3.5 1.7 - 7.7 K/uL   Lymphocytes Relative 21 %   Lymphs Abs 1.1 0.7 - 4.0 K/uL   Monocytes Relative 11 %   Monocytes Absolute 0.6 0.1 - 1.0 K/uL   Eosinophils Relative 0 %   Eosinophils Absolute 0.0 0.0 - 0.5 K/uL   Basophils Relative 0 %   Basophils Absolute 0.0 0.0 - 0.1 K/uL   Immature Granulocytes 0 %   Abs Immature Granulocytes 0.02 0.00 - 0.07 K/uL    Comment: Performed at [facility], [address] [place], [place] [address]  Comprehensive metabolic panel  Status: Abnormal   Collection Time: 03/26/19 10:51 AM  Result Value Ref Range   Sodium 133 (L) 135 - 145 mmol/L   Potassium 4.0 3.5 - 5.1 mmol/L   Chloride 101 98 - 111 mmol/L   CO2 19 (L) 22 - 32 mmol/L   Glucose, Bld 125 (H) 70 - 99 mg/dL   BUN 38 (H) 8 - 23 mg/dL   Creatinine, Ser 4.542.54 (H) 0.44 - 1.00 mg/dL   Calcium 8.7 (L) 8.9 - 10.3 mg/dL   Total Protein 8.1 6.5 - 8.1 g/dL   Albumin 3.7 3.5 - 5.0 g/dL   AST 31 15 - 41 U/L   ALT 19 0 - 44 U/L   Alkaline Phosphatase 51 38 - 126 U/L   Total Bilirubin 1.2 0.3 - 1.2 mg/dL   GFR calc non Af Amer 19 (L) >60 mL/min   GFR calc Af Amer 22 (L) >60 mL/min   Anion gap 13 5 - 15    Comment: Performed at [facility], [address] [place], [place] [address]  Lipase, blood     Status: None   Collection Time: 03/26/19 10:51 AM  Result Value Ref Range   Lipase 42 11 - 51 U/L    Comment: Performed at [facility], [address] [place], [place] [address]  Protime-INR     Status: None   Collection Time: 03/26/19 10:51 AM  Result Value Ref Range   Prothrombin Time 13.9 11.4 - 15.2 seconds   INR 1.1 0.8 - 1.2    Comment: (NOTE) INR goal varies based on device and disease states. Performed at [facility], [address] [place], [place] [address]   SARS Coronavirus 2 (CEPHEID - Performed in [facility] hospital lab), Hosp Order     Status: Abnormal   Collection Time: 03/26/19 10:52 AM   Specimen: Nasopharyngeal Swab  Result Value Ref Range   SARS Coronavirus 2 POSITIVE (A) NEGATIVE    Comment: CRITICAL RESULT CALLED TO, READ BACK BY AND VERIFIED WITH: RN C COBD [id number] AT 1337 BY CM (NOTE) If result is NEGATIVE SARS-CoV-2 target nucleic acids are NOT DETECTED. The SARS-CoV-2 RNA is generally detectable in upper and lower  respiratory specimens during the acute phase of infection. The lowest  concentration of SARS-CoV-2 viral copies this assay can detect is 250  copies / mL. A negative result does not preclude SARS-CoV-2 infection  and should not be used as the sole basis for treatment or other  patient management decisions.  A negative result may occur with  improper specimen collection / handling, submission of specimen other  than nasopharyngeal swab, presence of viral mutation(s) within the  areas targeted by this assay, and inadequate number of viral copies  (<250 copies / mL). A negative result must be combined with clinical  observations, patient history, and epidemiological information. If result is POSITIVE SARS-CoV-2 target nucleic acids are DETECTED . The SARS-CoV-2 RNA is generally detectable in upper and lower  respiratory specimens during the acute phase of infection.  Positive  results are indicative of active infection with SARS-CoV-2.  Clinical  correlation with patient history and other diagnostic information is  necessary to determine patient infection  status.  Positive results do  not rule out bacterial infection or co-infection with other viruses. If result is PRESUMPTIVE POSTIVE SARS-CoV-2 nucleic acids MAY BE PRESENT.   A presumptive positive result was obtained on the submitted specimen  and confirmed on repeat testing.  While 2019 novel coronavirus  (SARS-CoV-2) nucleic acids may be present in the submitted sample  additional confirmatory testing may be necessary for epidemiological  and / or clinical management purposes  to differentiate between  SARS-CoV-2 and other Sarbecovirus currently known to infect humans.  If clinically indicated additional testing with an alternate test  methodology [id number] ) is advised. The SARS-CoV-2 RNA is generally  detectable in upper and lower respiratory specimens during the acute  phase of infection. The expected result is Negative. Fact Sheet for Patients:  StrictlyIdeas.no Fact Sheet for Healthcare Providers: BankingDealers.co.za This test is not yet approved or cleared by the [place] FDA and has been authorized for detection and/or diagnosis of SARS-CoV-2 by FDA under an Emergency Use Authorization (EUA).  This EUA will remain in effect (meaning this test can be used) for the duration of the COVID-19 declaration under Section 564(b)(1) of the Act, 21 U.S.C. section 360bbb-3(b)(1), unless the authorization is terminated or revoked sooner. Performed at [facility], [place] [address], [place], [place] [address]   Lactic acid, plasma     Status: None   Collection Time: 03/26/19 11:50 AM  Result Value Ref Range   Lactic Acid, Venous 1.1 0.5 - 1.9 mmol/L    Comment: Performed at [place] [address], [place], [place] [address]   Dg Chest Port 1 View  Result Date: 03/26/2019 CLINICAL DATA:  63 year old female with abdominal pain, nausea and vomiting EXAM: PORTABLE CHEST 1 VIEW COMPARISON:  Prior chest x-ray 10/08/2009  FINDINGS: The lungs are clear and negative for focal airspace consolidation, pulmonary edema or suspicious pulmonary nodule. No pleural effusion or pneumothorax. Cardiac and mediastinal contours are within normal limits. No acute fracture or lytic or blastic osseous lesions. The visualized upper abdominal bowel gas pattern is unremarkable. IMPRESSION: No active disease. Electronically Signed   By: Jacqulynn Cadet M.D.   On: 03/26/2019 11:11    Pending Labs Unresulted Labs (From admission, onward)    Start     Ordered   03/26/19 1402  D-dimer, quantitative  ONCE - STAT,   STAT    Comments: Used for prognosis and bed placement. Do not order CT or V/Q.    03/26/19 1401   03/26/19 1402  Procalcitonin  ONCE - STAT,   STAT     03/26/19 1401   03/26/19 1402  Lactate dehydrogenase  Once,   STAT     03/26/19 1401   03/26/19 1402  Ferritin  Once,   STAT     03/26/19 1401   03/26/19 1402  Triglycerides  Once,   STAT     03/26/19 1401   03/26/19 1402  Fibrinogen  Once,   STAT     03/26/19 1401   03/26/19 1402  C-reactive protein  Once,   STAT     03/26/19 1401   03/26/19 1052  Lactic acid, plasma  Now then every 2 hours,   STAT     03/26/19 1052   03/26/19 1052  Culture, blood (routine x 2)  BLOOD CULTURE X 2,   STAT     03/26/19 1052   03/26/19 1052  Urine culture  ONCE - STAT,   STAT     03/26/19 1052   03/26/19 1051  Urinalysis, Routine w reflex microscopic  (ED Abdominal Pain)  ONCE - STAT,   STAT     03/26/19 1052   Signed and Held  HIV antibody (Routine Testing)  Once,   R  Signed and Held   Signed and Held  Basic metabolic panel  Tomorrow morning,   R     Signed and Held          Vitals/Pain Today's Vitals   03/26/19 1215 03/26/19 1245 03/26/19 1345 03/26/19 1400  BP: (!) 107/51 120/64 114/76 116/73  Pulse: 86 79 85 80  Resp: 19 17 20 16   Temp:      TempSrc:      SpO2: 96% 100% 98% 100%  Weight:      Height:      PainSc:        Isolation Precautions Airborne and  Contact precautions  Medications Medications  sodium chloride 0.9 % bolus 500 mL (0 mLs Intravenous Stopped 03/26/19 1410)  ondansetron (ZOFRAN) injection 4 mg (4 mg Intravenous Given 03/26/19 1215)    Mobility walks Moderate fall risk   Focused Assessments    R Recommendations: See Admitting Provider Note  Report given to:   Additional Notes:

## 2019-03-26 NOTE — ED Triage Notes (Signed)
Pt arrives POV from home c/o abdominal pain, n/v that started on Monday. Pt reports she "feelings like she needs to vomit but gags." Pt's oral temp was 100.1.

## 2019-03-26 NOTE — H&P (Signed)
[facility]                                                                                    Patient Demographics  Gwendolyn Hicks, is a 63 y.o. female  CSN: [id number]  MRN: [id number]  DOB - 1955-09-17  Admit Date - 03/26/2019  Outpatient Primary MD for the patient is Sherene Sires, DO   With History of -  Past Medical History:  Diagnosis Date  . Anemia   . Depression   . GERD (gastroesophageal reflux disease)   . Hypertension       Past Surgical History:  Procedure Laterality Date  . CESAREAN SECTION     2    in for   Chief Complaint  Patient presents with  . Abdominal Pain  . n/v     HPI  Gwendolyn Hicks  is a 63 y.o. female, with past medical history significant for hypertension, GERD, depression and history of acute blood loss anemia and hyperthyroidism presenting with 2 days history of abdominal discomfort with nausea but no vomiting.  Patient denies any fever or chills at home.  Patient denies any history of contact with any COVID-19 positive patients .  No history of diarrhea , chest pains or palpitations. In the emergency room the patient was noted to be cough with positive and in acute on chronic renal failure.  Inflammatory markers are still pending.    Review of Systems    In addition to the HPI above,  No Fever-chills, No Headache, No changes with Vision or hearing, No problems swallowing food or Liquids, No Chest pain, Cough or Shortness of Breath, No Bowel movements are regular, No Blood in stool or Urine, No dysuria, No new skin rashes or bruises, No new joints pains-aches,  No new weakness, tingling, numbness in any extremity, No recent weight gain or loss, No polyuria, polydypsia or polyphagia, No significant Mental Stressors.  A full 10 point Review of Systems was done, except as stated above, all other Review of Systems were negative.   Social History Social History   Tobacco Use  . Smoking status: Never Smoker  .  Smokeless tobacco: Never Used  Substance Use Topics  . Alcohol use: No     Family History Family History  Problem Relation Age of Onset  . Hypertension Mother   . Heart disease Mother   . Hypertension Father   . Heart disease Father      Prior to Admission medications   Medication Sig Start Date End Date Taking? Authorizing Provider  aspirin (ANACIN) 81 MG EC tablet Take 81 mg by mouth daily.    Yes [provider]  carvedilol (COREG) 3.125 MG tablet TAKE 1 TABLET BY MOUTH TWICE DAILY Patient taking differently: Take 3.125 mg by mouth 2 (two) times daily with a meal.  04/16/18  Yes Bland, Scott, DO  levothyroxine (SYNTHROID) 100 MCG tablet Take 1 tablet (100 mcg total) by mouth daily. 03/07/19  Yes Sela Hilding, MD  lisinopril-hydrochlorothiazide (PRINZIDE,ZESTORETIC) 20-12.5 MG tablet Take 2 tablets by mouth daily. 09/16/18 03/26/19 Yes Bland, Scott, DO  omeprazole (PRILOSEC OTC) 20 MG tablet Take 20 mg by mouth 2 (  two) times a day.   Yes [provider]  famotidine (PEPCID) 10 MG tablet Take 1 tablet (10 mg total) by mouth 2 (two) times daily. Patient not taking: Reported on 03/26/2019 03/03/19 06/01/19  Marthenia RollingBland, Scott, DO  loratadine (CLARITIN) 10 MG tablet Take 1 tablet (10 mg total) by mouth daily as needed for allergies. Patient not taking: Reported on 03/26/2019 08/03/18   Marthenia RollingBland, Scott, DO  ondansetron (ZOFRAN) 4 MG tablet Take 1 tablet (4 mg total) by mouth every 6 (six) hours as needed for nausea. Patient not taking: Reported on 03/26/2019 10/09/16   Dione BoozeGlick, David, MD    Allergies  Allergen Reactions  . Diphenhydramine Hcl Itching    Physical Exam  Vitals  Blood pressure 116/73, pulse 80, temperature 100.1 F (37.8 C), temperature source Oral, resp. rate 16, height 5\' 4"  (1.626 m), weight 90.7 kg, SpO2 100 %.   1. General looks tired, no acute distress  2. Normal affect and insight, Not Suicidal or Homicidal, Awake Alert, Oriented X 3.  3. No F.N  deficits, grossly, patient moving all extremities  4. Ears and Eyes appear Normal, Conjunctivae clear, PERRLA. Moist Oral Mucosa.  5. Supple Neck, No JVD, No cervical lymphadenopathy appriciated, No Carotid Bruits.  6. Symmetrical Chest wall movement, Good air movement bilaterally, CTAB.  7. RRR, No Gallops, Rubs or Murmurs, No Parasternal Heave.  8. Positive Bowel Sounds, Abdomen Soft, Non tender, No organomegaly appriciated,No rebound -guarding or rigidity.  9.  No Cyanosis, Normal Skin Turgor, No Skin Rash or Bruise.  10. Good muscle tone,  joints appear normal , no effusions, Normal ROM.    Data Review  CBC Recent Labs  Lab 03/26/19 1051  WBC 5.1  HGB 11.0*  HCT 32.3*  PLT 141*  MCV 84.1  MCH 28.6  MCHC 34.1  RDW 12.4  LYMPHSABS 1.1  MONOABS 0.6  EOSABS 0.0  BASOSABS 0.0   ------------------------------------------------------------------------------------------------------------------  Chemistries  Recent Labs  Lab 03/26/19 1051  NA 133*  K 4.0  CL 101  CO2 19*  GLUCOSE 125*  BUN 38*  CREATININE 2.54*  CALCIUM 8.7*  AST 31  ALT 19  ALKPHOS 51  BILITOT 1.2   ------------------------------------------------------------------------------------------------------------------ estimated creatinine clearance is 24.7 mL/min (A) (by C-G formula based on SCr of 2.54 mg/dL (H)). ------------------------------------------------------------------------------------------------------------------ No results for input(s): TSH, T4TOTAL, T3FREE, THYROIDAB in the last 72 hours.  Invalid input(s): FREET3   Coagulation profile Recent Labs  Lab 03/26/19 1051  INR 1.1   ------------------------------------------------------------------------------------------------------------------- No results for input(s): DDIMER in the last 72 hours. -------------------------------------------------------------------------------------------------------------------  Cardiac  Enzymes No results for input(s): CKMB, TROPONINI, MYOGLOBIN in the last 168 hours.  Invalid input(s): CK ------------------------------------------------------------------------------------------------------------------ Invalid input(s): POCBNP   ---------------------------------------------------------------------------------------------------------------  Urinalysis    Component Value Date/Time   COLORURINE YELLOW 03/26/2019 1339   APPEARANCEUR HAZY (A) 03/26/2019 1339   LABSPEC 1.011 03/26/2019 1339   PHURINE 5.0 03/26/2019 1339   GLUCOSEU NEGATIVE 03/26/2019 1339   HGBUR SMALL (A) 03/26/2019 1339   HGBUR negative 10/24/2009 1457   BILIRUBINUR NEGATIVE 03/26/2019 1339   BILIRUBINUR NEG 08/28/2015 1100   KETONESUR NEGATIVE 03/26/2019 1339   PROTEINUR NEGATIVE 03/26/2019 1339   UROBILINOGEN 0.2 08/28/2015 1100   UROBILINOGEN 0.2 05/13/2014 1531   NITRITE NEGATIVE 03/26/2019 1339   LEUKOCYTESUR TRACE (A) 03/26/2019 1339    ----------------------------------------------------------------------------------------------------------------   Imaging results:   Dg Chest Port 1 View  Result Date: 03/26/2019 CLINICAL DATA:  63 year old female with abdominal pain, nausea and vomiting EXAM: PORTABLE  CHEST 1 VIEW COMPARISON:  Prior chest x-ray 10/08/2009 FINDINGS: The lungs are clear and negative for focal airspace consolidation, pulmonary edema or suspicious pulmonary nodule. No pleural effusion or pneumothorax. Cardiac and mediastinal contours are within normal limits. No acute fracture or lytic or blastic osseous lesions. The visualized upper abdominal bowel gas pattern is unremarkable. IMPRESSION: No active disease. Electronically Signed   By: Malachy MoanHeath  McCullough M.D.   On: 03/26/2019 11:11      Assessment & Plan  Acute on chronic renal failure stage III IV fluids Hold lisinopril/hydrochlorothiazide  COVID 19+ Inflammatory markers ordered No oxygen need at this  time  Abdominal pain with history of GERD with nausea Symptomatic treatment at this time Full liquid diet Monitor   DVT Prophylaxis Heparin  AM Labs Ordered, also please review Full Orders  Family Communication: Discussed with son Marquita PalmsMario.  Code Status full  Disposition Plan: Home  Time spent in minutes : 38 minutes  Condition GUARDED   @SIGNATURE @

## 2019-03-27 ENCOUNTER — Encounter (HOSPITAL_COMMUNITY): Payer: Self-pay | Admitting: *Deleted

## 2019-03-27 ENCOUNTER — Other Ambulatory Visit: Payer: Self-pay

## 2019-03-27 LAB — CBC WITH DIFFERENTIAL/PLATELET
Abs Immature Granulocytes: 0.01 10*3/uL (ref 0.00–0.07)
Basophils Absolute: 0 10*3/uL (ref 0.0–0.1)
Basophils Relative: 0 %
Eosinophils Absolute: 0 10*3/uL (ref 0.0–0.5)
Eosinophils Relative: 0 %
HCT: 28.7 % — ABNORMAL LOW (ref 36.0–46.0)
Hemoglobin: 9.8 g/dL — ABNORMAL LOW (ref 12.0–15.0)
Immature Granulocytes: 0 %
Lymphocytes Relative: 24 %
Lymphs Abs: 1.2 10*3/uL (ref 0.7–4.0)
MCH: 28.7 pg (ref 26.0–34.0)
MCHC: 34.1 g/dL (ref 30.0–36.0)
MCV: 83.9 fL (ref 80.0–100.0)
Monocytes Absolute: 0.5 10*3/uL (ref 0.1–1.0)
Monocytes Relative: 10 %
Neutro Abs: 3.4 10*3/uL (ref 1.7–7.7)
Neutrophils Relative %: 66 %
Platelets: 121 10*3/uL — ABNORMAL LOW (ref 150–400)
RBC: 3.42 MIL/uL — ABNORMAL LOW (ref 3.87–5.11)
RDW: 12.7 % (ref 11.5–15.5)
WBC: 5.2 10*3/uL (ref 4.0–10.5)
nRBC: 0 % (ref 0.0–0.2)

## 2019-03-27 LAB — HEPATIC FUNCTION PANEL
ALT: 18 U/L (ref 0–44)
AST: 30 U/L (ref 15–41)
Albumin: 3.6 g/dL (ref 3.5–5.0)
Alkaline Phosphatase: 49 U/L (ref 38–126)
Bilirubin, Direct: 0.1 mg/dL (ref 0.0–0.2)
Indirect Bilirubin: 0.7 mg/dL (ref 0.3–0.9)
Total Bilirubin: 0.8 mg/dL (ref 0.3–1.2)
Total Protein: 7.8 g/dL (ref 6.5–8.1)

## 2019-03-27 LAB — CK: Total CK: 156 U/L (ref 38–234)

## 2019-03-27 LAB — MAGNESIUM: Magnesium: 2.3 mg/dL (ref 1.7–2.4)

## 2019-03-27 LAB — BASIC METABOLIC PANEL
Anion gap: 13 (ref 5–15)
BUN: 32 mg/dL — ABNORMAL HIGH (ref 8–23)
CO2: 19 mmol/L — ABNORMAL LOW (ref 22–32)
Calcium: 8.3 mg/dL — ABNORMAL LOW (ref 8.9–10.3)
Chloride: 104 mmol/L (ref 98–111)
Creatinine, Ser: 2.05 mg/dL — ABNORMAL HIGH (ref 0.44–1.00)
GFR calc Af Amer: 29 mL/min — ABNORMAL LOW (ref >60)
GFR calc non Af Amer: 25 mL/min — ABNORMAL LOW (ref >60)
Glucose, Bld: 90 mg/dL (ref 70–99)
Potassium: 3.7 mmol/L (ref 3.5–5.1)
Sodium: 136 mmol/L (ref 135–145)

## 2019-03-27 LAB — D-DIMER, QUANTITATIVE: D-Dimer, Quant: >20 ug/mL-FEU — ABNORMAL HIGH (ref 0.00–0.50)

## 2019-03-27 LAB — PHOSPHORUS: Phosphorus: 1.9 mg/dL — ABNORMAL LOW (ref 2.5–4.6)

## 2019-03-27 LAB — C-REACTIVE PROTEIN: CRP: 3.7 mg/dL — ABNORMAL HIGH (ref <1.0)

## 2019-03-27 LAB — URINE CULTURE

## 2019-03-27 LAB — HIV ANTIBODY (ROUTINE TESTING W REFLEX): HIV Screen 4th Generation wRfx: NONREACTIVE

## 2019-03-27 LAB — FERRITIN: Ferritin: 346 ng/mL — ABNORMAL HIGH (ref 11–307)

## 2019-03-27 MED ORDER — ACETAMINOPHEN 325 MG PO TABS
650.0000 mg | ORAL_TABLET | Freq: Four times a day (QID) | ORAL | Status: DC | PRN
  Administered 2019-03-27 – 2019-03-29 (×7): 650 mg via ORAL
  Filled 2019-03-27 (×7): qty 2

## 2019-03-27 NOTE — Progress Notes (Signed)
Called and updated patient's son, Freida Busman, on patient's condition.  All questions addressed.  Earleen Reaper RN

## 2019-03-27 NOTE — Progress Notes (Signed)
New Admission Note:   Arrival Method:   From [facility] ED Mental Orientation:  A & O x 4 Telemetry:   NSR Assessment: Completed Skin:  WNL IV:  20 gauge Rt AC Pain: Denies Tubes:  N/A Safety Measures: Safety Fall Prevention Plan has been given, discussed and signed Admission: Completed [facility]  Orientation: Patient has been orientated to the room, unit and staff.  Family:  Son, Freida Busman, called and updated on patient's condition.  Orders have been reviewed and implemented. Will continue to monitor the patient. Call light has been placed within reach and bed alarm has been activated.   Earleen Reaper RN- BC, [employer]

## 2019-03-27 NOTE — Progress Notes (Signed)
Temp 102.5 Ice packs applied.  MD paged for Tylenol order

## 2019-03-28 ENCOUNTER — Inpatient Hospital Stay (HOSPITAL_COMMUNITY): Payer: Medicare Other

## 2019-03-28 LAB — CBC WITH DIFFERENTIAL/PLATELET
Abs Immature Granulocytes: 0.03 10*3/uL (ref 0.00–0.07)
Basophils Absolute: 0 10*3/uL (ref 0.0–0.1)
Basophils Relative: 0 %
Eosinophils Absolute: 0 10*3/uL (ref 0.0–0.5)
Eosinophils Relative: 0 %
HCT: 27.4 % — ABNORMAL LOW (ref 36.0–46.0)
Hemoglobin: 9.1 g/dL — ABNORMAL LOW (ref 12.0–15.0)
Immature Granulocytes: 1 %
Lymphocytes Relative: 29 %
Lymphs Abs: 1.4 10*3/uL (ref 0.7–4.0)
MCH: 27.7 pg (ref 26.0–34.0)
MCHC: 33.2 g/dL (ref 30.0–36.0)
MCV: 83.5 fL (ref 80.0–100.0)
Monocytes Absolute: 0.3 10*3/uL (ref 0.1–1.0)
Monocytes Relative: 7 %
Neutro Abs: 3 10*3/uL (ref 1.7–7.7)
Neutrophils Relative %: 63 %
Platelets: 119 10*3/uL — ABNORMAL LOW (ref 150–400)
RBC: 3.28 MIL/uL — ABNORMAL LOW (ref 3.87–5.11)
RDW: 12.4 % (ref 11.5–15.5)
WBC: 4.8 10*3/uL (ref 4.0–10.5)
nRBC: 0 % (ref 0.0–0.2)

## 2019-03-28 LAB — COMPREHENSIVE METABOLIC PANEL
ALT: 21 U/L (ref 0–44)
AST: 36 U/L (ref 15–41)
Albumin: 3.1 g/dL — ABNORMAL LOW (ref 3.5–5.0)
Alkaline Phosphatase: 43 U/L (ref 38–126)
Anion gap: 8 (ref 5–15)
BUN: 19 mg/dL (ref 8–23)
CO2: 21 mmol/L — ABNORMAL LOW (ref 22–32)
Calcium: 8.2 mg/dL — ABNORMAL LOW (ref 8.9–10.3)
Chloride: 108 mmol/L (ref 98–111)
Creatinine, Ser: 1.65 mg/dL — ABNORMAL HIGH (ref 0.44–1.00)
GFR calc Af Amer: 38 mL/min — ABNORMAL LOW (ref >60)
GFR calc non Af Amer: 33 mL/min — ABNORMAL LOW (ref >60)
Glucose, Bld: 98 mg/dL (ref 70–99)
Potassium: 3.5 mmol/L (ref 3.5–5.1)
Sodium: 137 mmol/L (ref 135–145)
Total Bilirubin: 0.6 mg/dL (ref 0.3–1.2)
Total Protein: 6.8 g/dL (ref 6.5–8.1)

## 2019-03-28 LAB — TSH: TSH: 2.808 u[IU]/mL (ref 0.350–4.500)

## 2019-03-28 LAB — C-REACTIVE PROTEIN: CRP: 5.2 mg/dL — ABNORMAL HIGH (ref <1.0)

## 2019-03-28 LAB — T4, FREE: Free T4: 1.23 ng/dL — ABNORMAL HIGH (ref 0.61–1.12)

## 2019-03-28 LAB — MAGNESIUM: Magnesium: 2 mg/dL (ref 1.7–2.4)

## 2019-03-28 LAB — D-DIMER, QUANTITATIVE: D-Dimer, Quant: >20 ug/mL-FEU — ABNORMAL HIGH (ref 0.00–0.50)

## 2019-03-28 MED ORDER — LOPERAMIDE HCL 2 MG PO CAPS
2.0000 mg | ORAL_CAPSULE | ORAL | Status: DC | PRN
  Administered 2019-03-28: 22:00:00 2 mg via ORAL
  Filled 2019-03-28: qty 1

## 2019-03-28 NOTE — Progress Notes (Signed)
PT Cancellation Note  Patient Details Name: Gwendolyn Hicks MRN: [id number] DOB: 1956-06-30   Cancelled Treatment:    Reason Eval/Treat Not Completed: Patient not medically ready  Noted D-dimer >20 and order for LE doppler for ?DVT. She had heparin bolus at 0453 today.   Will hold PT evaluation until heparin has been running 24 hours.   Jeanie Cooks Lurline Caver, PT 03/28/2019, 9:08 AM

## 2019-03-28 NOTE — Progress Notes (Signed)
[facility] Progress Note  Patient: Gwendolyn GarretMary C Hicks ZOX:[id number]   PCP: Marthenia RollingBland, Scott, DO DOB: 02-Mar-1956   DOA: 03/26/2019   DOS: 03/28/2019   Date of Service: the patient was seen and examined on 03/28/2019  Brief hospital course: Pt. with PMH of anemia, depression, GERD, HTN; admitted on 03/26/2019, presented with complaint of abdominal pain nausea and vomiting, was found to have acute kidney injury on chronic kidney disease associated with COVID-19 illness and dehydration. Currently further plan is continue supportive care.  Subjective: Still feeling fatigue and tired.  No nausea no vomiting no diarrhea reported.  Urine output adequate.  Eager to go home.  Febrile up to 102.  Assessment and Plan: 1. Acute COVID-19 Viral illness  With association with nausea and vomiting Lab Results  Component Value Date   SARSCOV2NAA POSITIVE (A) 03/26/2019   CXR: X2 normal without any infiltrate.  Recent Labs    03/26/19 1439 03/27/19 0500 03/27/19 0900 03/28/19 0930  DDIMER >20.00*  --  >20.00* >20.00*  FERRITIN 295 346*  --   --   LDH 195*  --   --   --   CRP 3.1* 3.7*  --  5.2*    Tmax last 24 hours: 100.4 Oxygen requirements: Room air both at rest as well as on exertion  Antibiotics: None Diuretics: None Vitamin C and Zinc: Continue DVT Prophylaxis: Subcutaneous Heparin   Remdesivir: Not indicated for now given normal chest x-ray Steroids: Not indicated since not hypoxic Actemra: Not indicated off-label use of Actemra: Prone positioning: Patient encouraged to stay in prone position as much as possible.  PPE During this encounter: Patient Isolation: Droplet + Contact HCP PPE: CAPR, gown. gloves Patient PPE: None  The treatment plan and use of medications and known side effects were discussed with patient/family. It was clearly explained that there is no proven definitive treatment for COVID-19 infection yet. Any medications used here are based on case reports/anecdotal data  which are not peer-reviewed and has not been studied using randomized control trials.  Complete risks and long-term side effects are unknown, however in the best clinical judgment they seem to be of some clinical benefit rather than medical risks.  Patient/family agree with the treatment plan and want to receive these treatments as indicated.   2.  Acute kidney injury including kidney disease stage III. Patient was given IV fluid. Patient is on lisinopril HCTZ which is currently on hold. Nausea and vomiting as well as diarrhea at home related to Coumadin and less likely this was responsible for the dehydration. We will continue to monitor renal function after stopping IV fluids. Concern is recurrent dehydration and the patient with active fluid infection fever.  3.  Essential hypertension. Hypotension. Patient is on Coreg and lisinopril HCTZ at home. Currently blood pressure is soft despite holding blood pressure medication. Patient was given IV fluids. We will monitor  4.  Hypothyroidism. Patient is on Synthroid 100 MCG. Currently TSH is normal free T4 is mildly elevated. No indication to adjust the dose in the setting of acute infection.  5.  GERD. Continue PPI.  6.  Obesity. Body mass index is 39.75 kg/m.  Dietary consultation  7.  Thrombocytopenia. Acute. In the setting of elevated d-dimer this could also be related to consumption We will check lower extremity Doppler to ensure DVT is ruled out.  Diet: Cardiac diet DVT Prophylaxis: Subcutaneous Heparin   Advance goals of care discussion: Full code  Family Communication: no family was present at bedside,  at the time of interview.   Disposition:  Discharge to Home .  Consultants: none Procedures: none  Scheduled Meds: . aspirin EC  81 mg Oral Daily  . heparin  5,000 Units Subcutaneous Q8H  . levothyroxine  100 mcg Oral Daily  . pantoprazole  40 mg Oral Daily   Continuous Infusions: PRN Meds: acetaminophen,  ondansetron (ZOFRAN) IV Antibiotics: Anti-infectives (From admission, onward)   None       Objective: Physical Exam: Vitals:   03/28/19 1500 03/28/19 1600 03/28/19 1630 03/28/19 1700  BP:   126/81   Pulse: 82 70 78 79  Resp: (!) 24  (!) 25 (!) 29  Temp:   99.9 F (37.7 C)   TempSrc:      SpO2: 100% 99% 98% 98%  Weight:      Height:        Intake/Output Summary (Last 24 hours) at 03/28/2019 1756 Last data filed at 03/28/2019 1600 Gross per 24 hour  Intake 2879.1 ml  Output 1300 ml  Net 1579.1 ml   Filed Weights   03/26/19 1028 03/27/19 0358  Weight: 90.7 kg 105.1 kg   General: alert and oriented to time, place, and person. Appear in mild distress, affect appropriate Eyes: PERRL, Conjunctiva normal ENT: Oral Mucosa Clear, moist  Neck: no JVD, no Abnormal Mass Or lumps Cardiovascular: S1 and S2 Present, no Murmur, peripheral pulses symmetrical Respiratory: normal respiratory effort, Bilateral Air entry equal and Decreased, no use of accessory muscle, Clear to Auscultation, no Crackles, no wheezes Abdomen: Bowel Sound present, Soft and no tenderness, no hernia Skin: no rashes  Extremities: trace Pedal edema, no calf tenderness Neurologic: normal without focal findings, mental status, speech normal, alert and oriented x3, PERLA, Motor strength 5/5 and symmetric and sensation grossly normal to light touch Gait not checked due to patient safety concerns  Data Reviewed: CBC: Recent Labs  Lab 03/26/19 1051 03/27/19 0500 03/28/19 0930  WBC 5.1 5.2 4.8  NEUTROABS 3.5 3.4 3.0  HGB 11.0* 9.8* 9.1*  HCT 32.3* 28.7* 27.4*  MCV 84.1 83.9 83.5  PLT 141* 121* 119*   Basic Metabolic Panel: Recent Labs  Lab 03/26/19 1051 03/27/19 0429 03/27/19 0500 03/28/19 0930  NA 133* 136  --  137  K 4.0 3.7  --  3.5  CL 101 104  --  108  CO2 19* 19*  --  21*  GLUCOSE 125* 90  --  98  BUN 38* 32*  --  19  CREATININE 2.54* 2.05*  --  1.65*  CALCIUM 8.7* 8.3*  --  8.2*  MG  --    --  2.3 2.0  PHOS  --   --  1.9*  --     Liver Function Tests: Recent Labs  Lab 03/26/19 1051 03/27/19 0500 03/28/19 0930  AST 31 30 36  ALT 19 18 21   ALKPHOS 51 49 43  BILITOT 1.2 0.8 0.6  PROT 8.1 7.8 6.8  ALBUMIN 3.7 3.6 3.1*   Recent Labs  Lab 03/26/19 1051  LIPASE 42   No results for input(s): AMMONIA in the last 168 hours. Coagulation Profile: Recent Labs  Lab 03/26/19 1051  INR 1.1   Cardiac Enzymes: Recent Labs  Lab 03/27/19 0500  CKTOTAL 156   BNP (last 3 results) No results for input(s): PROBNP in the last 8760 hours. CBG: No results for input(s): GLUCAP in the last 168 hours. Studies: Dg Chest Port 1 View  Result Date: 03/28/2019 CLINICAL DATA:  63 year old female  with shortness of breath. EXAM: PORTABLE CHEST 1 VIEW COMPARISON:  Chest x-ray 03/26/2019. FINDINGS: Lung volumes are normal. No consolidative airspace disease. No pleural effusions. No pneumothorax. No pulmonary nodule or mass noted. Pulmonary vasculature and the cardiomediastinal silhouette are within normal limits. Atherosclerotic calcifications in the arch of the aorta. IMPRESSION: 1.  No radiographic evidence of acute cardiopulmonary disease. 2. Aortic atherosclerosis. Electronically Signed   By: Vinnie Langton M.D.   On: 03/28/2019 08:32     Time spent: 35 minutes  Author: Berle Mull, MD [facility] 03/28/2019 5:56 PM  To reach On-call, see care teams to locate the attending and reach out to them via [email or web address]. If 7PM-7AM, please contact night-coverage If you still have difficulty reaching the attending provider, please page the [facility] (Director on Call) for [facility] on amion for assistance.

## 2019-03-28 NOTE — Progress Notes (Signed)
[facility] Progress Note  Patient: Gwendolyn Hicks EXB:[id number]   PCP: Marthenia RollingBland, Scott, DO DOB: 12-Apr-1956   DOA: 03/26/2019   DOS: 03/27/2019   Date of Service: the patient was seen and examined on 03/27/2019  Brief hospital course: Pt. with PMH of anemia, depression, GERD, HTN; admitted on 03/26/2019, presented with complaint of abdominal pain nausea and vomiting, was found to have acute kidney injury on chronic kidney disease associated with COVID-19 illness and dehydration. Currently further plan is continue supportive care.  Subjective: feeling fatigue and tired.  STILL HAS nausea no vomiting no diarrhea reported.  Urine output adequate. Febrile.  Assessment and Plan: 1. Acute COVID-19 Viral illness  With association with nausea and vomiting Lab Results  Component Value Date   SARSCOV2NAA POSITIVE (A) 03/26/2019   CXR: X2 normal without any infiltrate.  Recent Labs    03/26/19 1439 03/27/19 0500 03/27/19 0900 03/28/19 0930  DDIMER >20.00*  --  >20.00* >20.00*  FERRITIN 295 346*  --   --   LDH 195*  --   --   --   CRP 3.1* 3.7*  --  5.2*    Tmax last 24 hours: 101 Oxygen requirements: Room air   Antibiotics: None Diuretics: None Vitamin C and Zinc: Continue DVT Prophylaxis: Subcutaneous Heparin   Remdesivir: Not indicated for now given normal chest x-ray Steroids: Not indicated since not hypoxic Actemra: Not indicated off-label use of Actemra: Prone positioning: Patient encouraged to stay in prone position as much as possible.  PPE During this encounter: Patient Isolation: Droplet + Contact HCP PPE: CAPR, gown. gloves Patient PPE: None  The treatment plan and use of medications and known side effects were discussed with patient/family. It was clearly explained that there is no proven definitive treatment for COVID-19 infection yet. Any medications used here are based on case reports/anecdotal data which are not peer-reviewed and has not been studied using  randomized control trials.  Complete risks and long-term side effects are unknown, however in the best clinical judgment they seem to be of some clinical benefit rather than medical risks.  Patient/family agree with the treatment plan and want to receive these treatments as indicated.   2.  Acute kidney injury including kidney disease stage III. Patient on IV fluid. Patient is on lisinopril HCTZ which is currently on hold. Nausea and vomiting as well as diarrhea at home related to Coumadin and less likely this was responsible for the dehydration. We will continue to monitor renal function after stopping IV fluids. Concern is recurrent dehydration and the patient with active fluid infection fever.  3.  Essential hypertension. Hypotension. Patient is on Coreg and lisinopril HCTZ at home. Currently blood pressure is soft despite holding blood pressure medication. Patient given IV fluids. We will monitor  4.  Hypothyroidism. Patient is on Synthroid 100 MCG. Check TSH and free T4   5.  GERD. Continue PPI.  6.  Obesity. Body mass index is 39.75 kg/m.  Dietary consultation  Diet: Cardiac diet DVT Prophylaxis: Subcutaneous Heparin   Advance goals of care discussion: Full code  Family Communication: no family was present at bedside, at the time of interview.   Disposition:  Discharge to Home .  Consultants: none Procedures: none  Scheduled Meds: . aspirin EC  81 mg Oral Daily  . heparin  5,000 Units Subcutaneous Q8H  . levothyroxine  100 mcg Oral Daily  . pantoprazole  40 mg Oral Daily   Continuous Infusions: PRN Meds: acetaminophen, ondansetron (ZOFRAN) IV  Antibiotics: Anti-infectives (From admission, onward)   None       Objective: Physical Exam:   Filed Weights   03/26/19 1028 03/27/19 0358  Weight: 90.7 kg 105.1 kg   General: alert and oriented to time, place, and person. Appear in mild distress, affect appropriate Eyes: PERRL, Conjunctiva normal ENT: Oral  Mucosa Clear, moist  Neck: no JVD, no Abnormal Mass Or lumps Cardiovascular: S1 and S2 Present, no Murmur, peripheral pulses symmetrical Respiratory: normal respiratory effort, Bilateral Air entry equal and Decreased, no use of accessory muscle, Clear to Auscultation, no Crackles, no wheezes Abdomen: Bowel Sound present, Soft and no tenderness, no hernia Skin: no rashes  Extremities: trace Pedal edema, no calf tenderness Neurologic: normal without focal findings, mental status, speech normal, alert and oriented x3, PERLA, Motor strength 5/5 and symmetric and sensation grossly normal to light touch Gait not checked due to patient safety concerns  Data Reviewed: CBC: Lab 03/26/19 1051 03/27/19 0500  WBC 5.1 5.2  NEUTROABS 3.5 3.4  HGB 11.0* 9.8*  HCT 32.3* 28.7*  MCV 84.1 83.9  PLT 141* 700*   Basic Metabolic Panel: Lab 17/49/44 1051 03/27/19 0429 03/27/19 0500  NA 133* 136  --   K 4.0 3.7  --   CL 101 104  --   CO2 19* 19*  --   GLUCOSE 125* 90  --   BUN 38* 32*  --   CREATININE 2.54* 2.05*  --   CALCIUM 8.7* 8.3*  --   MG  --   --  2.3  PHOS  --   --  1.9*    Liver Function Tests: Lab 03/26/19 1051 03/27/19 0500  AST 31 30  ALT 19 18  ALKPHOS 51 49  BILITOT 1.2 0.8  PROT 8.1 7.8  ALBUMIN 3.7 3.6   Recent Labs  Lab 03/26/19 1051  LIPASE 42   No results for input(s): AMMONIA in the last 168 hours. Coagulation Profile: Recent Labs  Lab 03/26/19 1051  INR 1.1   Cardiac Enzymes: Recent Labs  Lab 03/27/19 0500  CKTOTAL 156   BNP (last 3 results) No results for input(s): PROBNP in the last 8760 hours. CBG: No results for input(s): GLUCAP in the last 168 hours. Studies:    Time spent: 35 minutes  Author: Berle Mull, MD [facility] 03/27/2019 6:09 PM  To reach On-call, see care teams to locate the attending and reach out to them via [email or web address]. If 7PM-7AM, please contact night-coverage If you still have difficulty reaching the  attending provider, please page the [facility] (Director on Call) for [facility] on amion for assistance.

## 2019-03-28 NOTE — Evaluation (Signed)
Physical Therapy Evaluation Patient Details Name: Gwendolyn Hicks MRN: [id number] DOB: [birth year] Today's Date: 03/28/2019   History of Present Illness  63 y.o. female, with past medical history significant for hypertension, GERD, depression and history of acute blood loss anemia and hyperthyroidism presenting 03/26/19 with 2 days history of abdominal discomfort with nausea but no vomiting. +COVID 19 Patient with d-dimer >20.0 and MD ordered bolus of heparin and bil LE dopplers due to bil LE edema. MD made aware that PT would hold activity until had been 24 hours since initiation of heparin or negative dopplers obtained. MD reported he wanted pt up due to doubted pt has DVT.   Clinical Impression   Patient evaluated by Physical Therapy with no further PT needs identified. She is independent with her mobility and vital signs stable on room air. PT is signing off. Thank you for this referral.     Follow Up Recommendations No PT follow up    Equipment Recommendations  None recommended by PT    Recommendations for Other Services       Precautions / Restrictions Precautions Precautions: None      Mobility  Bed Mobility Overal bed mobility: Independent                Transfers Overall transfer level: Independent                  Ambulation/Gait Ambulation/Gait assistance: Supervision Gait Distance (Feet): 20 Feet(x2 (to/from bathroom)) Assistive device: None Gait Pattern/deviations: Step-through pattern;Wide base of support   Gait velocity interpretation: 1.31 - 2.62 ft/sec, indicative of limited community ambulator General Gait Details: no desaturation or elevated HR  Stairs            Wheelchair Mobility    Modified Rankin (Stroke Patients Only)       Balance Overall balance assessment: Independent                                           Pertinent Vitals/Pain Pain Assessment: No/denies pain    Home Living Family/patient  expects to be discharged to:: Private residence Living Arrangements: Children Available Help at Discharge: Family;Available PRN/intermittently(son to stay with her (his wife and kids going to move out)) Type of Home: House Home Access: Stairs to enter Entrance Stairs-Rails: Right Entrance Stairs-Number of Steps: several   Home Equipment: None      Prior Function Level of Independence: Independent         Comments: denies h/o falls or imbalance     Hand Dominance        Extremity/Trunk Assessment   Upper Extremity Assessment Upper Extremity Assessment: Overall WFL for tasks assessed    Lower Extremity Assessment Lower Extremity Assessment: Overall WFL for tasks assessed    Cervical / Trunk Assessment Cervical / Trunk Assessment: Other exceptions Cervical / Trunk Exceptions: obese  Communication   Communication: No difficulties  Cognition Arousal/Alertness: Awake/alert Behavior During Therapy: WFL for tasks assessed/performed Overall Cognitive Status: Within Functional Limits for tasks assessed                                        General Comments General comments (skin integrity, edema, etc.): Patient states unsure why MD ordered PT    Exercises     Assessment/Plan  PT Assessment Patent does not need any further PT services  PT Problem List         PT Treatment Interventions      PT Goals (Current goals can be found in the Care Plan section)  Acute Rehab PT Goals PT Goal Formulation: All assessment and education complete, DC therapy    Frequency     Barriers to discharge        Co-evaluation               AM-PAC PT "6 Clicks" Mobility  Outcome Measure Help needed turning from your back to your side while in a flat bed without using bedrails?: None Help needed moving from lying on your back to sitting on the side of a flat bed without using bedrails?: None Help needed moving to and from a bed to a chair (including a  wheelchair)?: None Help needed standing up from a chair using your arms (e.g., wheelchair or bedside chair)?: None Help needed to walk in hospital room?: None Help needed climbing 3-5 steps with a railing? : None 6 Click Score: 24    End of Session   Activity Tolerance: Patient tolerated treatment well Patient left: in bed;with call bell/phone within reach Nurse Communication: Mobility status;Other (comment)(d/c from PT) PT Visit Diagnosis: Difficulty in walking, not elsewhere classified (R26.2)    Time: 8119-14781352-1407 PT Time Calculation (min) (ACUTE ONLY): 15 min   Charges:   PT Evaluation $PT Eval Low Complexity: 1 Low            [employer] Sciences CorporationLynn P Jarely Juncaj, PT 03/28/2019, 2:17 PM

## 2019-03-28 NOTE — Care Management Important Message (Signed)
Important Message  Patient Details  Name: Gwendolyn Hicks MRN: [id number] Date of Birth: 1956-05-11   Medicare Important Message Given:  Yes - Important Message mailed due to current National Emergency    Verbal consent obtained due to current National Emergency  Relationship to patient: Child Contact Name: Jae Bruck Call Date: 03/28/19  Time: 1528 Phone: [phone] Outcome: Spoke with contact Important Message mailed to: Other (must enter comment)  Patient son ask [place] to send IM  to his address at [address] [place] [address]  Orbie Pyo 03/28/2019, 3:29 PM

## 2019-03-29 ENCOUNTER — Inpatient Hospital Stay (HOSPITAL_COMMUNITY): Payer: Medicare Other

## 2019-03-29 DIAGNOSIS — R609 Edema, unspecified: Secondary | ICD-10-CM

## 2019-03-29 LAB — COMPREHENSIVE METABOLIC PANEL
ALT: 19 U/L (ref 0–44)
AST: 34 U/L (ref 15–41)
Albumin: 3 g/dL — ABNORMAL LOW (ref 3.5–5.0)
Alkaline Phosphatase: 41 U/L (ref 38–126)
Anion gap: 10 (ref 5–15)
BUN: 14 mg/dL (ref 8–23)
CO2: 18 mmol/L — ABNORMAL LOW (ref 22–32)
Calcium: 8 mg/dL — ABNORMAL LOW (ref 8.9–10.3)
Chloride: 107 mmol/L (ref 98–111)
Creatinine, Ser: 1.4 mg/dL — ABNORMAL HIGH (ref 0.44–1.00)
GFR calc Af Amer: 46 mL/min — ABNORMAL LOW (ref >60)
GFR calc non Af Amer: 40 mL/min — ABNORMAL LOW (ref >60)
Glucose, Bld: 76 mg/dL (ref 70–99)
Potassium: 3.5 mmol/L (ref 3.5–5.1)
Sodium: 135 mmol/L (ref 135–145)
Total Bilirubin: 0.5 mg/dL (ref 0.3–1.2)
Total Protein: 6.7 g/dL (ref 6.5–8.1)

## 2019-03-29 LAB — CBC WITH DIFFERENTIAL/PLATELET
Abs Immature Granulocytes: 0.04 10*3/uL (ref 0.00–0.07)
Basophils Absolute: 0 10*3/uL (ref 0.0–0.1)
Basophils Relative: 0 %
Eosinophils Absolute: 0 10*3/uL (ref 0.0–0.5)
Eosinophils Relative: 0 %
HCT: 25.9 % — ABNORMAL LOW (ref 36.0–46.0)
Hemoglobin: 8.9 g/dL — ABNORMAL LOW (ref 12.0–15.0)
Immature Granulocytes: 1 %
Lymphocytes Relative: 24 %
Lymphs Abs: 1.4 10*3/uL (ref 0.7–4.0)
MCH: 28.3 pg (ref 26.0–34.0)
MCHC: 34.4 g/dL (ref 30.0–36.0)
MCV: 82.5 fL (ref 80.0–100.0)
Monocytes Absolute: 0.4 10*3/uL (ref 0.1–1.0)
Monocytes Relative: 7 %
Neutro Abs: 4.1 10*3/uL (ref 1.7–7.7)
Neutrophils Relative %: 68 %
Platelets: 123 10*3/uL — ABNORMAL LOW (ref 150–400)
RBC: 3.14 MIL/uL — ABNORMAL LOW (ref 3.87–5.11)
RDW: 12.4 % (ref 11.5–15.5)
WBC: 5.9 10*3/uL (ref 4.0–10.5)
nRBC: 0 % (ref 0.0–0.2)

## 2019-03-29 LAB — D-DIMER, QUANTITATIVE: D-Dimer, Quant: >20 ug/mL-FEU — ABNORMAL HIGH (ref 0.00–0.50)

## 2019-03-29 LAB — MAGNESIUM: Magnesium: 1.9 mg/dL (ref 1.7–2.4)

## 2019-03-29 LAB — C-REACTIVE PROTEIN: CRP: 7.3 mg/dL — ABNORMAL HIGH (ref <1.0)

## 2019-03-29 MED ORDER — THERMOMETER MISC: 1.0000 | 0 refills | Status: DC | PRN

## 2019-03-29 MED ORDER — VITAMIN C 500 MG PO TABS: 250.0000 mg | ORAL_TABLET | Freq: Every day | ORAL | 0 refills | Status: DC

## 2019-03-29 MED ORDER — PULSE OXIMETER MISC: 1.0000 | 0 refills | Status: AC | PRN

## 2019-03-29 MED ORDER — ZINC 220 (50 ZN) MG PO CAPS: 1.0000 | ORAL_CAPSULE | Freq: Every day | ORAL | 0 refills | Status: DC

## 2019-03-29 NOTE — Progress Notes (Signed)
Pt requesting ultrasound - reports she has a mass in her throat.  It tested benign.  Her MD told her she may have to have it removed if it starts causing problems.  Pt reports difficulty swallowing, and believes the mass is causing the problems with her eyes and the temps.    Will make sure day RN is aware of pt request.

## 2019-03-29 NOTE — Progress Notes (Signed)
Bilateral lower extremity venous duplex has been completed. Preliminary results can be found in CV Proc through chart review.   03/29/19 9:40 AM Gwendolyn Hicks RVT

## 2019-03-29 NOTE — Discharge Instructions (Signed)
COVID-19 Frequently Asked Questions COVID-19 (coronavirus disease) is an infection that is caused by a large family of viruses. Some viruses cause illness in people and others cause illness in animals like camels, cats, and bats. In some cases, the viruses that cause illness in animals can spread to humans. Where did the coronavirus come from? In December 2019, [place] told the [facility]) of several cases of lung disease (human respiratory illness). These cases were linked to an open seafood and livestock market in the city of [place]. The link to the seafood and livestock market suggests that the virus may have spread from animals to humans. However, since that first outbreak in December, the virus has also been shown to spread from person to person. What is the name of the disease and the virus? Disease name Early on, this disease was called novel coronavirus. This is because scientists determined that the disease was caused by a new (novel) respiratory virus. The World Health Organization [facility]) has now named the disease COVID-19, or coronavirus disease. Virus name The virus that causes the disease is called severe acute respiratory syndrome coronavirus 2 (SARS-CoV-2). More information on disease and virus naming World Health Organization [facility]): www.who.int/emergencies/diseases/novel-coronavirus-2019/technical-guidance/naming-the-coronavirus-disease-(covid-2019)-and-the-virus-that-causes-it Who is at risk for complications from coronavirus disease? Some people may be at higher risk for complications from coronavirus disease. This includes older adults and people who have chronic diseases, such as heart disease, diabetes, and lung disease. If you are at higher risk for complications, take these extra precautions:  Avoid close contact with people who are sick or have a fever or cough. Stay at least 3-6 ft (1-2 m) away from them, if possible.  Wash your hands often with soap and  water for at least 20 seconds.  Avoid touching your face, mouth, nose, or eyes.  Keep supplies on hand at home, such as food, medicine, and cleaning supplies.  Stay home as much as possible.  Avoid social gatherings and travel. How does coronavirus disease spread? The virus that causes coronavirus disease spreads easily from person to person (is contagious). There are also cases of community-spread disease. This means the disease has spread to:  People who have no known contact with other infected people.  People who have not traveled to areas where there are known cases. It appears to spread from one person to another through droplets from coughing or sneezing. Can I get the virus from touching surfaces or objects? There is still a lot that we do not know about the virus that causes coronavirus disease. Scientists are basing a lot of information on what they know about similar viruses, such as:  Viruses cannot generally survive on surfaces for long. They need a human body (host) to survive.  It is more likely that the virus is spread by close contact with people who are sick (direct contact), such as through: ? Shaking hands or hugging. ? Breathing in respiratory droplets that travel through the air. This can happen when an infected person coughs or sneezes on or near other people.  It is less likely that the virus is spread when a person touches a surface or object that has the virus on it (indirect contact). The virus may be able to enter the body if the person touches a surface or object and then touches his or her face, eyes, nose, or mouth. Can a person spread the virus without having symptoms of the disease? It may be possible for the virus to spread before a  person has symptoms of the disease, but this is most likely not the main way the virus is spreading. It is more likely for the virus to spread by being in close contact with people who are sick and breathing in the respiratory  droplets of a sick person's cough or sneeze. What are the symptoms of coronavirus disease? Symptoms vary from person to person and can range from mild to severe. Symptoms may include:  Fever.  Cough.  Tiredness, weakness, or fatigue.  Fast breathing or feeling short of breath. These symptoms can appear anywhere from 2 to 14 days after you have been exposed to the virus. If you develop symptoms, call your health care provider. People with severe symptoms may need hospital care. If I am exposed to the virus, how long does it take before symptoms start? Symptoms of coronavirus disease may appear anywhere from 2 to 14 days after a person has been exposed to the virus. If you develop symptoms, call your health care provider. Should I be tested for this virus? Your health care provider will decide whether to test you based on your symptoms, history of exposure, and your risk factors. How does a health care provider test for this virus? Health care providers will collect samples to send for testing. Samples may include:  Taking a swab of fluid from the nose.  Taking fluid from the lungs by having you cough up mucus (sputum) into a sterile cup.  Taking a blood sample.  Taking a stool or urine sample. Is there a treatment or vaccine for this virus? Currently, there is no vaccine to prevent coronavirus disease. Also, there are no medicines like antibiotics or antivirals to treat the virus. A person who becomes sick is given supportive care, which means rest and fluids. A person may also relieve his or her symptoms by using over-the-counter medicines that treat sneezing, coughing, and runny nose. These are the same medicines that a person takes for the common cold. If you develop symptoms, call your health care provider. People with severe symptoms may need hospital care. What can I do to protect myself and my family from this virus?     You can protect yourself and your family by taking the  same actions that you would take to prevent the spread of other viruses. Take the following actions:  Wash your hands often with soap and water for at least 20 seconds. If soap and water are not available, use alcohol-based hand sanitizer.  Avoid touching your face, mouth, nose, or eyes.  Cough or sneeze into a tissue, sleeve, or elbow. Do not cough or sneeze into your hand or the air. ? If you cough or sneeze into a tissue, throw it away immediately and wash your hands.  Disinfect objects and surfaces that you frequently touch every day.  Avoid close contact with people who are sick or have a fever or cough. Stay at least 3-6 ft (1-2 m) away from them, if possible.  Stay home if you are sick, except to get medical care. Call your health care provider before you get medical care.  Make sure your vaccines are up to date. Ask your health care provider what vaccines you need. What should I do if I need to travel? Follow travel recommendations from your local health authority, the CDC, and WHO. Travel information and advice  Centers for Disease Control and Prevention (CDC): BodyEditor.hu  World Health Organization [facility]): ThirdIncome.ca Know the risks and take action to protect your  health  You are at higher risk of getting coronavirus disease if you are traveling to areas with an outbreak or if you are exposed to travelers from areas with an outbreak.  Wash your hands often and practice good hygiene to lower the risk of catching or spreading the virus. What should I do if I am sick? General instructions to stop the spread of infection  Wash your hands often with soap and water for at least 20 seconds. If soap and water are not available, use alcohol-based hand sanitizer.  Cough or sneeze into a tissue, sleeve, or elbow. Do not cough or sneeze into your hand or the air.  If you cough or  sneeze into a tissue, throw it away immediately and wash your hands.  Stay home unless you must get medical care. Call your health care provider or local health authority before you get medical care.  Avoid public areas. Do not take public transportation, if possible.  If you can, wear a mask if you must go out of the house or if you are in close contact with someone who is not sick. Keep your home clean  Disinfect objects and surfaces that are frequently touched every day. This may include: ? Counters and tables. ? Doorknobs and light switches. ? Sinks and faucets. ? Electronics such as phones, remote controls, keyboards, computers, and tablets.  Wash dishes in hot, soapy water or use a dishwasher. Air-dry your dishes.  Wash laundry in hot water. Prevent infecting other household members  Let healthy household members care for children and pets, if possible. If you have to care for children or pets, wash your hands often and wear a mask.  Sleep in a different bedroom or bed, if possible.  Do not share personal items, such as razors, toothbrushes, deodorant, combs, brushes, towels, and washcloths. Where to find more information Centers for Disease Control and Prevention (CDC)  Information and news updates: https://www.butler-gonzalez.com/ World Health Organization [facility])  Information and news updates: MissExecutive.com.ee  Coronavirus health topic: https://www.castaneda.info/  Questions and answers on COVID-19: OpportunityDebt.at  Global tracker: who.sprinklr.com American Academy of Pediatrics (AAP)  Information for families: www.healthychildren.org/English/health-issues/conditions/chest-lungs/Pages/2019-Novel-Coronavirus.aspx The coronavirus situation is changing rapidly. Check your local health authority website or the CDC and [facility] websites for updates and news. When should I contact a health care  provider?  Contact your health care provider if you have symptoms of an infection, such as fever or cough, and you: ? Have been near anyone who is known to have coronavirus disease. ? Have come into contact with a person who is suspected to have coronavirus disease. ? Have traveled outside of the country. When should I get emergency medical care?  Get help right away by calling your local emergency services (911 in the [place] if you have: ? Trouble breathing. ? Pain or pressure in your chest. ? Confusion. ? Blue-tinged lips and fingernails. ? Difficulty waking from sleep. ? Symptoms that get worse. Let the emergency medical personnel know if you think you have coronavirus disease. Summary  A new respiratory virus is spreading from person to person and causing COVID-19 (coronavirus disease).  The virus that causes COVID-19 appears to spread easily. It spreads from one person to another through droplets from coughing or sneezing.  Older adults and those with chronic diseases are at higher risk of disease. If you are at higher risk for complications, take extra precautions.  There is currently no vaccine to prevent coronavirus disease. There are no medicines, such as  antibiotics or antivirals, to treat the virus.  You can protect yourself and your family by washing your hands often, avoiding touching your face, and covering your coughs and sneezes. This information is not intended to replace advice given to you by your health care provider. Make sure you discuss any questions you have with your health care provider. Document Released: 12/21/2018 Document Revised: 12/21/2018 Document Reviewed: 12/21/2018 Elsevier Patient Education  2020 [employer] COVID-19 Frequently Asked Questions COVID-19 (coronavirus disease) is an infection that is caused by a large family of viruses. Some viruses cause illness in people and others cause illness in animals like camels, cats, and bats. In some  cases, the viruses that cause illness in animals can spread to humans. Where did the coronavirus come from? In December 2019, [place] told the [employer] [facility](WHO) of several cases of lung disease (human respiratory illness). These cases were linked to an open seafood and livestock market in the city of [place]. The link to the seafood and livestock market suggests that the virus may have spread from animals to humans. However, since that first outbreak in December, the virus has also been shown to spread from person to person. What is the name of the disease and the virus? Disease name Early on, this disease was called novel coronavirus. This is because scientists determined that the disease was caused by a new (novel) respiratory virus. The World Health Organization [facility](WHO) has now named the disease COVID-19, or coronavirus disease. Virus name The virus that causes the disease is called severe acute respiratory syndrome coronavirus 2 (SARS-CoV-2). More information on disease and virus naming World Health Organization [facility](WHO): www.who.int/emergencies/diseases/novel-coronavirus-2019/technical-guidance/naming-the-coronavirus-disease-(covid-2019)-and-the-virus-that-causes-it Who is at risk for complications from coronavirus disease? Some people may be at higher risk for complications from coronavirus disease. This includes older adults and people who have chronic diseases, such as heart disease, diabetes, and lung disease. If you are at higher risk for complications, take these extra precautions:  Avoid close contact with people who are sick or have a fever or cough. Stay at least 3-6 ft (1-2 m) away from them, if possible.  Wash your hands often with soap and water for at least 20 seconds.  Avoid touching your face, mouth, nose, or eyes.  Keep supplies on hand at home, such as food, medicine, and cleaning supplies.  Stay home as much as possible.  Avoid social gatherings and travel. How  does coronavirus disease spread? The virus that causes coronavirus disease spreads easily from person to person (is contagious). There are also cases of community-spread disease. This means the disease has spread to:  People who have no known contact with other infected people.  People who have not traveled to areas where there are known cases. It appears to spread from one person to another through droplets from coughing or sneezing. Can I get the virus from touching surfaces or objects? There is still a lot that we do not know about the virus that causes coronavirus disease. Scientists are basing a lot of information on what they know about similar viruses, such as:  Viruses cannot generally survive on surfaces for long. They need a human body (host) to survive.  It is more likely that the virus is spread by close contact with people who are sick (direct contact), such as through: ? Shaking hands or hugging. ? Breathing in respiratory droplets that travel through the air. This can happen when an infected person coughs or sneezes on or near other people.  It  is less likely that the virus is spread when a person touches a surface or object that has the virus on it (indirect contact). The virus may be able to enter the body if the person touches a surface or object and then touches his or her face, eyes, nose, or mouth. Can a person spread the virus without having symptoms of the disease? It may be possible for the virus to spread before a person has symptoms of the disease, but this is most likely not the main way the virus is spreading. It is more likely for the virus to spread by being in close contact with people who are sick and breathing in the respiratory droplets of a sick person's cough or sneeze. What are the symptoms of coronavirus disease? Symptoms vary from person to person and can range from mild to severe. Symptoms may include:  Fever.  Cough.  Tiredness, weakness, or  fatigue.  Fast breathing or feeling short of breath. These symptoms can appear anywhere from 2 to 14 days after you have been exposed to the virus. If you develop symptoms, call your health care provider. People with severe symptoms may need hospital care. If I am exposed to the virus, how long does it take before symptoms start? Symptoms of coronavirus disease may appear anywhere from 2 to 14 days after a person has been exposed to the virus. If you develop symptoms, call your health care provider. Should I be tested for this virus? Your health care provider will decide whether to test you based on your symptoms, history of exposure, and your risk factors. How does a health care provider test for this virus? Health care providers will collect samples to send for testing. Samples may include:  Taking a swab of fluid from the nose.  Taking fluid from the lungs by having you cough up mucus (sputum) into a sterile cup.  Taking a blood sample.  Taking a stool or urine sample. Is there a treatment or vaccine for this virus? Currently, there is no vaccine to prevent coronavirus disease. Also, there are no medicines like antibiotics or antivirals to treat the virus. A person who becomes sick is given supportive care, which means rest and fluids. A person may also relieve his or her symptoms by using over-the-counter medicines that treat sneezing, coughing, and runny nose. These are the same medicines that a person takes for the common cold. If you develop symptoms, call your health care provider. People with severe symptoms may need hospital care. What can I do to protect myself and my family from this virus?     You can protect yourself and your family by taking the same actions that you would take to prevent the spread of other viruses. Take the following actions:  Wash your hands often with soap and water for at least 20 seconds. If soap and water are not available, use alcohol-based hand  sanitizer.  Avoid touching your face, mouth, nose, or eyes.  Cough or sneeze into a tissue, sleeve, or elbow. Do not cough or sneeze into your hand or the air. ? If you cough or sneeze into a tissue, throw it away immediately and wash your hands.  Disinfect objects and surfaces that you frequently touch every day.  Avoid close contact with people who are sick or have a fever or cough. Stay at least 3-6 ft (1-2 m) away from them, if possible.  Stay home if you are sick, except to get medical care. Call your  health care provider before you get medical care.  Make sure your vaccines are up to date. Ask your health care provider what vaccines you need. What should I do if I need to travel? Follow travel recommendations from your local health authority, the CDC, and WHO. Travel information and advice  Centers for Disease Control and Prevention (CDC): BodyEditor.hu  World Health Organization [facility]): ThirdIncome.ca Know the risks and take action to protect your health  You are at higher risk of getting coronavirus disease if you are traveling to areas with an outbreak or if you are exposed to travelers from areas with an outbreak.  Wash your hands often and practice good hygiene to lower the risk of catching or spreading the virus. What should I do if I am sick? General instructions to stop the spread of infection  Wash your hands often with soap and water for at least 20 seconds. If soap and water are not available, use alcohol-based hand sanitizer.  Cough or sneeze into a tissue, sleeve, or elbow. Do not cough or sneeze into your hand or the air.  If you cough or sneeze into a tissue, throw it away immediately and wash your hands.  Stay home unless you must get medical care. Call your health care provider or local health authority before you get medical care.  Avoid public areas. Do not take  public transportation, if possible.  If you can, wear a mask if you must go out of the house or if you are in close contact with someone who is not sick. Keep your home clean  Disinfect objects and surfaces that are frequently touched every day. This may include: ? Counters and tables. ? Doorknobs and light switches. ? Sinks and faucets. ? Electronics such as phones, remote controls, keyboards, computers, and tablets.  Wash dishes in hot, soapy water or use a dishwasher. Air-dry your dishes.  Wash laundry in hot water. Prevent infecting other household members  Let healthy household members care for children and pets, if possible. If you have to care for children or pets, wash your hands often and wear a mask.  Sleep in a different bedroom or bed, if possible.  Do not share personal items, such as razors, toothbrushes, deodorant, combs, brushes, towels, and washcloths. Where to find more information Centers for Disease Control and Prevention (CDC)  Information and news updates: https://www.butler-gonzalez.com/ World Health Organization [facility])  Information and news updates: MissExecutive.com.ee  Coronavirus health topic: https://www.castaneda.info/  Questions and answers on COVID-19: OpportunityDebt.at  Global tracker: who.sprinklr.com American Academy of Pediatrics (AAP)  Information for families: www.healthychildren.org/English/health-issues/conditions/chest-lungs/Pages/2019-Novel-Coronavirus.aspx The coronavirus situation is changing rapidly. Check your local health authority website or the CDC and [facility] websites for updates and news. When should I contact a health care provider?  Contact your health care provider if you have symptoms of an infection, such as fever or cough, and you: ? Have been near anyone who is known to have coronavirus disease. ? Have come into contact with a person who is  suspected to have coronavirus disease. ? Have traveled outside of the country. When should I get emergency medical care?  Get help right away by calling your local emergency services (911 in the [place] if you have: ? Trouble breathing. ? Pain or pressure in your chest. ? Confusion. ? Blue-tinged lips and fingernails. ? Difficulty waking from sleep. ? Symptoms that get worse. Let the emergency medical personnel know if you think you have coronavirus disease. Summary  A new respiratory virus  is spreading from person to person and causing COVID-19 (coronavirus disease).  The virus that causes COVID-19 appears to spread easily. It spreads from one person to another through droplets from coughing or sneezing.  Older adults and those with chronic diseases are at higher risk of disease. If you are at higher risk for complications, take extra precautions.  There is currently no vaccine to prevent coronavirus disease. There are no medicines, such as antibiotics or antivirals, to treat the virus.  You can protect yourself and your family by washing your hands often, avoiding touching your face, and covering your coughs and sneezes. This information is not intended to replace advice given to you by your health care provider. Make sure you discuss any questions you have with your health care provider. Document Released: 12/21/2018 Document Revised: 12/21/2018 Document Reviewed: 12/21/2018 Elsevier Patient Education  2020 Elsevier Inc.  COVID-19: How to Protect Yourself and Others Know how it spreads  There is currently no vaccine to prevent coronavirus disease 2019 (COVID-19).  The best way to prevent illness is to avoid being exposed to this virus.  The virus is thought to spread mainly from person-to-person. ? Between people who are in close contact with one another (within about 6 feet). ? Through respiratory droplets produced when an infected person coughs, sneezes or talks. ? These  droplets can land in the mouths or noses of people who are nearby or possibly be inhaled into the lungs. ? Some recent studies have suggested that COVID-19 may be spread by people who are not showing symptoms. Everyone should Clean your hands often  Wash your hands often with soap and water for at least 20 seconds especially after you have been in a public place, or after blowing your nose, coughing, or sneezing.  If soap and water are not readily available, use a hand sanitizer that contains at least 60% alcohol. Cover all surfaces of your hands and rub them together until they feel dry.  Avoid touching your eyes, nose, and mouth with unwashed hands. Avoid close contact  Stay home if you are sick.  Avoid close contact with people who are sick.  Put distance between yourself and other people. ? Remember that some people without symptoms may be able to spread virus. ? This is especially important for people who are at higher risk of getting very [place].[place].www.cdc.gov/coronavirus/2019-ncov/need-extra-precautions/people-at-higher-risk.html Cover your mouth and nose with a cloth face cover when around others  You could spread COVID-19 to others even if you do not feel sick.  Everyone should wear a cloth face cover when they have to go out in public, for example to the grocery store or to pick up other necessities. ? Cloth face coverings should not be placed on young children under age 42, anyone who has trouble breathing, or is unconscious, incapacitated or otherwise unable to remove the mask without assistance.  The cloth face cover is meant to protect other people in case you are infected.  Do NOT use a facemask meant for a Research scientist (physical sciences)healthcare worker.  Continue to keep about 6 feet between yourself and others. The cloth face cover is not a substitute for social distancing. Cover coughs and sneezes  If you are in a private setting and do not have on your cloth face covering, remember to always cover  your mouth and nose with a tissue when you cough or sneeze or use the inside of your elbow.  Throw used tissues in the trash.  Immediately wash your hands with soap  and water for at least 20 seconds. If soap and water are not readily available, clean your hands with a hand sanitizer that contains at least 60% alcohol. Clean and disinfect  Clean AND disinfect frequently touched surfaces daily. This includes tables, doorknobs, light switches, countertops, handles, desks, phones, keyboards, toilets, faucets, and sinks. ktimeonline.comwww.cdc.gov/coronavirus/2019-ncov/prevent-getting-sick/disinfecting-your-home.html  If surfaces are dirty, clean them: Use detergent or soap and water prior to disinfection.  Then, use a household disinfectant. You can see a list of EPA-registered household disinfectants here. [place]Flowers.co.ukcdc.gov/coronavirus 01/11/2019 This information is not intended to replace advice given to you by your health care provider. Make sure you discuss any questions you have with your health care provider. Document Released: 12/21/2018 Document Revised: 01/19/2019 Document Reviewed: 12/21/2018 Elsevier Patient Education  2020 Elsevier Inc.   Acute Kidney Injury, Adult  Acute kidney injury is a sudden worsening of kidney function. The kidneys are organs that have several jobs. They filter the blood to remove waste products and extra fluid. They also maintain a healthy balance of minerals and hormones in the body, which helps control blood pressure and keep bones strong. With this condition, your kidneys do not do their jobs as well as they should. This condition ranges from mild to severe. Over time it may develop into long-lasting (chronic) kidney disease. Early detection and treatment may prevent acute kidney injury from developing into a chronic condition. What are the causes? Common causes of this condition include:  A problem with blood flow to the kidneys. This may be caused by: ? Low blood pressure  (hypotension) or shock. ? Blood loss. ? Heart and blood vessel (cardiovascular) disease. ? Severe burns. ? Liver disease.  Direct damage to the kidneys. This may be caused by: ? Certain medicines. ? A kidney infection. ? Poisoning. ? Being around or in contact with toxic substances. ? A surgical wound. ? A hard, direct hit to the kidney area.  A sudden blockage of urine flow. This may be caused by: ? Cancer. ? Kidney stones. ? An enlarged prostate in males. What are the signs or symptoms? Symptoms of this condition may not be obvious until the condition becomes severe. Symptoms of this condition can include:  Tiredness (lethargy), or difficulty staying awake.  Nausea or vomiting.  Swelling (edema) of the face, legs, ankles, or feet.  Problems with urination, such as: ? Abdominal pain, or pain along the side of your stomach (flank). ? Decreased urine production. ? Decrease in the force of urine flow.  Muscle twitches and cramps, especially in the legs.  Confusion or trouble concentrating.  Loss of appetite.  Fever. How is this diagnosed? This condition may be diagnosed with tests, including:  Blood tests.  Urine tests.  Imaging tests.  A test in which a sample of tissue is removed from the kidneys to be examined under a microscope (kidney biopsy). How is this treated? Treatment for this condition depends on the cause and how severe the condition is. In mild cases, treatment may not be needed. The kidneys may heal on their own. In more severe cases, treatment will involve:  Treating the cause of the kidney injury. This may involve changing any medicines you are taking or adjusting your dosage.  Fluids. You may need specialized IV fluids to balance your body's needs.  Having a catheter placed to drain urine and prevent blockages.  Preventing problems from occurring. This may mean avoiding certain medicines or procedures that can cause further injury to the  kidneys. In some  cases treatment may also require:  A procedure to remove toxic wastes from the body (dialysis or continuous renal replacement therapy - CRRT).  Surgery. This may be done to repair a torn kidney, or to remove the blockage from the urinary system. Follow these instructions at home: Medicines  Take over-the-counter and prescription medicines only as told by your health care provider.  Do not take any new medicines without your health care provider's approval. Many medicines can worsen your kidney damage.  Do not take any vitamin and mineral supplements without your health care provider's approval. Many nutritional supplements can worsen your kidney damage. Lifestyle  If your health care provider prescribed changes to your diet, follow them. You may need to decrease the amount of protein you eat.  Achieve and maintain a healthy weight. If you need help with this, ask your health care provider.  Start or continue an exercise plan. Try to exercise at least 30 minutes a day, 5 days a week.  Do not use any tobacco products, such as cigarettes, chewing tobacco, and e-cigarettes. If you need help quitting, ask your health care provider. General instructions  Keep track of your blood pressure. Report changes in your blood pressure as told by your health care provider.  Stay up to date with immunizations. Ask your health care provider which immunizations you need.  Keep all follow-up visits as told by your health care provider. This is important. Where to find more information  American Association of Kidney Patients: BombTimer.gl  National Kidney Foundation: www.kidney.[place]: https://mathis.com/  Life Options Rehabilitation Program: ? www.lifeoptions.org ? www.kidneyschool.org Contact a health care provider if:  Your symptoms get worse.  You develop new symptoms. Get help right away if:  You develop symptoms of worsening kidney disease, which  include: ? Headaches. ? Abnormally dark or light skin. ? Easy bruising. ? Frequent hiccups. ? Chest pain. ? Shortness of breath. ? End of menstruation in women. ? Seizures. ? Confusion or altered mental status. ? Abdominal or back pain. ? Itchiness.  You have a fever.  Your body is producing less urine.  You have pain or bleeding when you urinate. Summary  Acute kidney injury is a sudden worsening of kidney function.  Acute kidney injury can be caused by problems with blood flow to the kidneys, direct damage to the kidneys, and sudden blockage of urine flow.  Symptoms of this condition may not be obvious until it becomes severe. Symptoms may include edema, lethargy, confusion, nausea or vomiting, and problems passing urine.  This condition can usually be diagnosed with blood tests, urine tests, and imaging tests. Sometimes a kidney biopsy is done to diagnose this condition.  Treatment for this condition often involves treating the underlying cause. It is treated with fluids, medicines, dialysis, diet changes, or surgery. This information is not intended to replace advice given to you by your health care provider. Make sure you discuss any questions you have with your health care provider. Document Released: 03/10/2011 Document Revised: 08/07/2017 Document Reviewed: 08/15/2016 Elsevier Patient Education  2020 [employer].

## 2019-03-29 NOTE — TOC Transition Note (Signed)
Transition of Care [facility]) - CM/SW Discharge Note   Patient Details  Name: Gwendolyn Hicks MRN: [id number] Date of Birth: 11-Oct-1955  [facility] CM/SW Contact:  Weston Anna, LCSW Phone Number: 03/29/2019, 1:23 PM   Clinical Narrative:     Patient stable for discharge today. Home health RN set up with [facility] and referral completed with Georgina Snell. Orders completed by MD. CSW notified patients son of [place] agency and is agreeable to agency.   Final next level of care: Home w Home Health Services Barriers to Discharge: No Barriers Identified   Patient Goals and CMS Choice   CMS Medicare.gov Compare Post Acute Care list provided to:: Patient Represenative (must comment)(Son)    Discharge Placement                       Discharge Plan and Services     Post Acute Care Choice: Home Health                    HH Arranged: RN [facility] Agency: [place] Date [facility] Agency Contacted: 03/29/19 Time [place]: [place] Representative spoke with at [place]: [place] Determinants of Health ([place]) Interventions     Readmission Risk Interventions No flowsheet data found.

## 2019-03-30 NOTE — Discharge Summary (Signed)
[facility] Discharge Summary   Patient: Gwendolyn Hicks ZOX:[id number]   PCP: Marthenia Rolling, DO DOB: 1955/09/23   Date of admission: 03/26/2019   Date of discharge: 03/29/2019     Discharge Diagnoses:  Principal diagnosis covid 19 infection  Active Problems:   Acute renal failure (ARF) (HCC)   Admitted From: home Disposition:  Home   Recommendations for Outpatient Follow-up:  1. Please follow up with PCP in 1week   Follow-up Information    Marthenia Rolling, DO. Schedule an appointment as soon as possible for a visit in 1 week(s).   Specialty: Family Medicine Contact information: [address] [place] [address] [phone]          Diet recommendation: cardiac diet  Activity: The patient is advised to gradually reintroduce usual activities,as tolerated .  Discharge Condition: good  Code Status: full code  History of present illness: As per the H and P dictated on admission, "Gwendolyn Hicks  is a 63 y.o. female, with past medical history significant for hypertension, GERD, depression and history of acute blood loss anemia and hyperthyroidism presenting with 2 days history of abdominal discomfort with nausea but no vomiting.  Patient denies any fever or chills at home.  Patient denies any history of contact with any COVID-19 positive patients .  No history of diarrhea , chest pains or palpitations. In the emergency room the patient was noted to be cough with positive and in acute on chronic renal failure.  Inflammatory markers are still pending."  Hospital Course:  Summary of her active problems in the hospital is as following. 1. Acute COVID-19 Viral illness  With association with nausea and vomiting Recent Labs[] Expand by Default       Lab Results  Component Value Date   SARSCOV2NAA POSITIVE (A) 03/26/2019     CXR: X2 normal without any infiltrate.  Recent Labs (last 2 labs) Expand by Default        Recent Labs    03/26/19 1439 03/27/19 0500  03/27/19 0900 03/28/19 0930  DDIMER >20.00*  --  >20.00* >20.00*  FERRITIN 295 346*  --   --   LDH 195*  --   --   --   CRP 3.1* 3.7*  --  5.2*      Tmax last 24 hours: 101.4 Oxygen requirements: Room air both at rest as well as on exertion  Antibiotics: None Diuretics: None Vitamin C and Zinc: Continue DVT Prophylaxis: Subcutaneous Heparin   Remdesivir: Not indicated for now given normal chest x-ray Steroids: Not indicated since not hypoxic Actemra: Not indicated off-label use of Actemra: Prone positioning: Patient encouraged to stay in prone position as much as possible.  PPE During this encounter: Patient Isolation: Droplet + Contact HCP PPE: CAPR, gown. gloves Patient PPE: None  Plan would be to go home with close monitoring and follow up.pulse ox and thermometer recommended   The treatment plan and use of medications and known side effects were discussed with patient/family. It was clearly explained that there is no proven definitive treatment for COVID-19 infection yet. Any medications used here are based on case reports/anecdotal data which are not peer-reviewed and has not been studied using randomized control trials.  Complete risks and long-term side effects are unknown, however in the best clinical judgment they seem to be of some clinical benefit rather than medical risks.  Patient/family agree with the treatment plan and want to receive these treatments as indicated.   2.  Acute kidney injury including  kidney disease stage III. Patient was given IV fluid. Patient is on lisinopril HCTZ which is currently on hold. Nausea and vomiting as well as diarrhea at home related to Coumadin and less likely this was responsible for the dehydration.  3.  Essential hypertension. Hypotension. Patient is on Coreg and lisinopril HCTZ at home. Currently blood pressure is soft despite holding blood pressure medication. Patient was given IV fluids.  4.  Hypothyroidism. H/o  multinodular goitor  Patient is on Synthroid 100 MCG. Currently TSH is normal free T4 is mildly elevated. No indication to adjust the dose in the setting of acute infection.  5.  GERD. Continue PPI.  6.  Obesity. Body mass index is 39.75 kg/m.  Dietary consultation  7.  Thrombocytopenia. Acute. DVT is ruled out. Monitor outpatient.   Patient was seen by physical therapy, who recommended no therapy needed on discharge, home heatlh RN was recommended which was arranged by case manager. On the day of the discharge the patient's vitals were stable, and no other acute medical condition were reported by patient. the patient was felt safe to be discharge at Home with Home health RN.  Consultants: none Procedures: none  DISCHARGE MEDICATION: Allergies as of 03/29/2019      Reactions   Diphenhydramine Hcl Itching      Medication List    STOP taking these medications   carvedilol 3.125 MG tablet Commonly known as: COREG   lisinopril-hydrochlorothiazide 20-12.5 MG tablet Commonly known as: ZESTORETIC     TAKE these medications   Anacin 81 MG EC tablet Generic drug: aspirin Take 81 mg by mouth daily.   famotidine 10 MG tablet Commonly known as: PEPCID Take 1 tablet (10 mg total) by mouth 2 (two) times daily.   levothyroxine 100 MCG tablet Commonly known as: SYNTHROID Take 1 tablet (100 mcg total) by mouth daily.   loratadine 10 MG tablet Commonly known as: CLARITIN Take 1 tablet (10 mg total) by mouth daily as needed for allergies.   omeprazole 20 MG tablet Commonly known as: PRILOSEC OTC Take 20 mg by mouth 2 (two) times a day.   ondansetron 4 MG tablet Commonly known as: ZOFRAN Take 1 tablet (4 mg total) by mouth every 6 (six) hours as needed for nausea.   Pulse Oximeter Misc 1 Act by Does not apply route as needed.   Thermometer Misc 1 Act by Does not apply route as needed.   vitamin C 500 MG tablet Commonly known as: ASCORBIC ACID Take 0.5 tablets  (250 mg total) by mouth daily.   Zinc 220 (50 Zn) MG Caps Take 1 tablet by mouth daily.      Allergies  Allergen Reactions   Diphenhydramine Hcl Itching   Discharge Instructions    MyChart COVID-19 home monitoring program   Complete by: Mar 29, 2019    Is the patient willing to use the [place] for home monitoring?: Yes   Temperature monitoring   Complete by: Mar 29, 2019    After how many days would you like to receive a notification of this patient's flowsheet entries?: 1   Diet - low sodium heart healthy   Complete by: As directed    Discharge instructions   Complete by: As directed    It is important that you read the given instructions as well as go over your medication list with RN to help you understand your care after this hospitalization.  Discharge Instructions: Please follow-up with PCP in 1-2 weeks  Please  request your primary care physician to go over all Hospital Tests and Procedure/Radiological results at the follow up. Please get all Hospital records sent to your PCP by signing hospital release before you go home.   Do not take more than prescribed Pain, Sleep and Anxiety Medications. You were cared for by a hospitalist during your hospital stay. If you have any questions about your discharge medications or the care you received while you were in the hospital after you are discharged, you can call the unit @UNIT @ you were admitted to and ask to speak with the hospitalist on call if the hospitalist that took care of you is not available.  Once you are discharged, your primary care physician will handle any further medical issues. Please note that NO REFILLS for any discharge medications will be authorized once you are discharged, as it is imperative that you return to your primary care physician (or establish a relationship with a primary care physician if you do not have one) for your aftercare needs so that they can reassess your need for medications and  monitor your lab values. You Must read complete instructions/literature along with all the possible adverse reactions/side effects for all the Medicines you take and that have been prescribed to you. Take any new Medicines after you have completely understood and accept all the possible adverse reactions/side effects. Wear Seat belts while driving. If you have smoked or chewed Tobacco in the last 2 yrs please stop smoking and/or stop any Recreational drug use.  If you drink alcohol, please moderate the use and do not drive, operating heavy machinery, perform activities at heights, swimming or participation in water activities or provide baby sitting services under influence.   Increase activity slowly   Complete by: As directed      Discharge Exam: Filed Weights   03/26/19 1028 03/27/19 0358  Weight: 90.7 kg 105.1 kg   Vitals:   03/29/19 1100 03/29/19 1200  BP: 121/62   Pulse: 79 76  Resp: (!) 26 20  Temp: (!) 101.4 F (38.6 C)   SpO2: 97% 99%   General: Appear in no distress, no Rash; Oral Mucosa Clear, moist. no Abnormal Mass Or lumps Cardiovascular: S1 and S2 Present, no Murmur, Respiratory: normal respiratory effort, Bilateral Air entry present and Clear to Auscultation, no Crackles, no wheezes Abdomen: Bowel Sound present, Soft and no tenderness, no hernia Extremities: no Pedal edema, no calf tenderness Neurology: alert and oriented to time, place, and person affect appropriate. normal without focal findings, mental status, speech normal, alert and oriented x3, PERLA, Motor strength 5/5 and symmetric and sensation grossly normal to light touch   The results of significant diagnostics from this hospitalization (including imaging, microbiology, ancillary and laboratory) are listed below for reference.    Significant Diagnostic Studies: Dg Chest Port 1 View  Result Date: 03/28/2019 CLINICAL DATA:  63 year old female with shortness of breath. EXAM: PORTABLE CHEST 1 VIEW  COMPARISON:  Chest x-ray 03/26/2019. FINDINGS: Lung volumes are normal. No consolidative airspace disease. No pleural effusions. No pneumothorax. No pulmonary nodule or mass noted. Pulmonary vasculature and the cardiomediastinal silhouette are within normal limits. Atherosclerotic calcifications in the arch of the aorta. IMPRESSION: 1.  No radiographic evidence of acute cardiopulmonary disease. 2. Aortic atherosclerosis. Electronically Signed   By: Trudie Reedaniel  Entrikin M.D.   On: 03/28/2019 08:32   Dg Chest Port 1 View  Result Date: 03/26/2019 CLINICAL DATA:  63 year old female with abdominal pain, nausea and vomiting EXAM: PORTABLE CHEST 1 VIEW  COMPARISON:  Prior chest x-ray 10/08/2009 FINDINGS: The lungs are clear and negative for focal airspace consolidation, pulmonary edema or suspicious pulmonary nodule. No pleural effusion or pneumothorax. Cardiac and mediastinal contours are within normal limits. No acute fracture or lytic or blastic osseous lesions. The visualized upper abdominal bowel gas pattern is unremarkable. IMPRESSION: No active disease. Electronically Signed   By: Malachy MoanHeath  McCullough M.D.   On: 03/26/2019 11:11   Vas [place]as Lower Extremity Venous (dvt)  Result Date: 03/29/2019  Lower Venous Study Indications: Edema. Other Indications: GVH. Risk Factors: COVID positive. Comparison Study: No prior studies. Performing Technologist: Chanda BusingGregory Collins RVT  Examination Guidelines: A complete evaluation includes B-mode imaging, spectral Doppler, color Doppler, and power Doppler as needed of all accessible portions of each vessel. Bilateral testing is considered an integral part of a complete examination. Limited examinations for reoccurring indications may be performed as noted.  +---------+---------------+---------+-----------+----------+-------+  RIGHT     Compressibility Phasicity Spontaneity Properties Summary  +---------+---------------+---------+-----------+----------+-------+  CFV       Full             Yes       Yes                             +---------+---------------+---------+-----------+----------+-------+  SFJ       Full                                                      +---------+---------------+---------+-----------+----------+-------+  FV Prox   Full                                                      +---------+---------------+---------+-----------+----------+-------+  FV Mid    Full                                                      +---------+---------------+---------+-----------+----------+-------+  FV Distal Full                                                      +---------+---------------+---------+-----------+----------+-------+  PFV       Full                                                      +---------+---------------+---------+-----------+----------+-------+  POP       Full            Yes       Yes                             +---------+---------------+---------+-----------+----------+-------+  PTV       Full                                                      +---------+---------------+---------+-----------+----------+-------+  PERO      Full                                                      +---------+---------------+---------+-----------+----------+-------+   +---------+---------------+---------+-----------+----------+-------+  LEFT      Compressibility Phasicity Spontaneity Properties Summary  +---------+---------------+---------+-----------+----------+-------+  CFV       Full            Yes       Yes                             +---------+---------------+---------+-----------+----------+-------+  SFJ       Full                                                      +---------+---------------+---------+-----------+----------+-------+  FV Prox   Full                                                      +---------+---------------+---------+-----------+----------+-------+  FV Mid    Full                                                       +---------+---------------+---------+-----------+----------+-------+  FV Distal Full                                                      +---------+---------------+---------+-----------+----------+-------+  PFV       Full                                                      +---------+---------------+---------+-----------+----------+-------+  POP       Full            Yes       Yes                             +---------+---------------+---------+-----------+----------+-------+  PTV       Full                                                      +---------+---------------+---------+-----------+----------+-------+  PERO      Full                                                      +---------+---------------+---------+-----------+----------+-------+  Summary: Right: There is no evidence of deep vein thrombosis in the lower extremity. No cystic structure found in the popliteal fossa. Left: There is no evidence of deep vein thrombosis in the lower extremity. No cystic structure found in the popliteal fossa.  *See table(s) above for measurements and observations. Electronically signed by Waverly Ferrari MD on 03/29/2019 at 2:02:35 PM.    Final     Microbiology: Recent Results (from the past 240 hour(s))  Urine culture     Status: None   Collection Time: 03/26/19 10:52 AM   Specimen: Urine, Random  Result Value Ref Range Status   Specimen Description URINE, RANDOM  Final   Special Requests   Final    NONE Performed at [facility], [address], [place], [place] [address]    Culture   Final    Multiple bacterial morphotypes present, none predominant. Suggest appropriate recollection if clinically indicated.   Report Status 03/27/2019 FINAL  Final  SARS Coronavirus 2 (CEPHEID - Performed in [facility] hospital lab), Hosp Order     Status: Abnormal   Collection Time: 03/26/19 10:52 AM   Specimen: Nasopharyngeal Swab  Result Value Ref Range Status   SARS Coronavirus 2 POSITIVE (A)  NEGATIVE Final    Comment: CRITICAL RESULT CALLED TO, READ BACK BY AND VERIFIED WITH: RN C COBD [id number] AT 1337 BY CM (NOTE) If result is NEGATIVE SARS-CoV-2 target nucleic acids are NOT DETECTED. The SARS-CoV-2 RNA is generally detectable in upper and lower  respiratory specimens during the acute phase of infection. The lowest  concentration of SARS-CoV-2 viral copies this assay can detect is 250  copies / mL. A negative result does not preclude SARS-CoV-2 infection  and should not be used as the sole basis for treatment or other  patient management decisions.  A negative result may occur with  improper specimen collection / handling, submission of specimen other  than nasopharyngeal swab, presence of viral mutation(s) within the  areas targeted by this assay, and inadequate number of viral copies  (<250 copies / mL). A negative result must be combined with clinical  observations, patient history, and epidemiological information. If result is POSITIVE SARS-CoV-2 target nucleic acids are DETECTED . The SARS-CoV-2 RNA is generally detectable in upper and lower  respiratory specimens during the acute phase of infection.  Positive  results are indicative of active infection with SARS-CoV-2.  Clinical  correlation with patient history and other diagnostic information is  necessary to determine patient infection status.  Positive results do  not rule out bacterial infection or co-infection with other viruses. If result is PRESUMPTIVE POSTIVE SARS-CoV-2 nucleic acids MAY BE PRESENT.   A presumptive positive result was obtained on the submitted specimen  and confirmed on repeat testing.  While 2019 novel coronavirus  (SARS-CoV-2) nucleic acids may be present in the submitted sample  additional confirmatory testing may be necessary for epidemiological  and / or clinical management purposes  to differentiate between  SARS-CoV-2 and other Sarbecovirus currently known to infect humans.  If  clinically indicated additional testing with an alternate test  methodology [id number] ) is advised. The SARS-CoV-2 RNA is generally  detectable in upper and lower respiratory specimens during the acute  phase of infection. The expected result is Negative. Fact Sheet for Patients:  BoilerBrush.com.cy Fact Sheet for Healthcare Providers: https://pope.com/ This test is not yet approved or cleared by the [place] FDA and has been authorized for detection and/or diagnosis of SARS-CoV-2 by FDA  under an Emergency Use Authorization (EUA).  This EUA will remain in effect (meaning this test can be used) for the duration of the COVID-19 declaration under Section 564(b)(1) of the Act, 21 U.S.C. section 360bbb-3(b)(1), unless the authorization is terminated or revoked sooner. Performed at [facility], [address] [place], [place] [address]   Culture, blood (routine x 2)     Status: None (Preliminary result)   Collection Time: 03/26/19 11:50 AM   Specimen: BLOOD  Result Value Ref Range Status   Specimen Description BLOOD RIGHT ANTECUBITAL  Final   Special Requests   Final    BOTTLES DRAWN AEROBIC AND ANAEROBIC Blood Culture results may not be optimal due to an inadequate volume of blood received in culture bottles   Culture   Final    NO GROWTH 3 DAYS Performed at [facility], [address] [place], [place] [address]    Report Status PENDING  Incomplete  Culture, blood (routine x 2)     Status: None (Preliminary result)   Collection Time: 03/26/19 11:52 AM   Specimen: BLOOD  Result Value Ref Range Status   Specimen Description BLOOD LEFT ANTECUBITAL  Final   Special Requests   Final    BOTTLES DRAWN AEROBIC AND ANAEROBIC Blood Culture adequate volume   Culture   Final    NO GROWTH 3 DAYS Performed at [facility], [address] [place], [place] [address]    Report Status PENDING  Incomplete      Labs: CBC: Recent Labs  Lab 03/26/19 1051 03/27/19 0500 03/28/19 0930 03/29/19 0350  WBC 5.1 5.2 4.8 5.9  NEUTROABS 3.5 3.4 3.0 4.1  HGB 11.0* 9.8* 9.1* 8.9*  HCT 32.3* 28.7* 27.4* 25.9*  MCV 84.1 83.9 83.5 82.5  PLT 141* 121* 119* 123*   Basic Metabolic Panel: Recent Labs  Lab 03/26/19 1051 03/27/19 0429 03/27/19 0500 03/28/19 0930 03/29/19 0350  NA 133* 136  --  137 135  K 4.0 3.7  --  3.5 3.5  CL 101 104  --  108 107  CO2 19* 19*  --  21* 18*  GLUCOSE 125* 90  --  98 76  BUN 38* 32*  --  19 14  CREATININE 2.54* 2.05*  --  1.65* 1.40*  CALCIUM 8.7* 8.3*  --  8.2* 8.0*  MG  --   --  2.3 2.0 1.9  PHOS  --   --  1.9*  --   --    Liver Function Tests: Recent Labs  Lab 03/26/19 1051 03/27/19 0500 03/28/19 0930 03/29/19 0350  AST 31 30 36 34  ALT 19 18 21 19   ALKPHOS 51 49 43 41  BILITOT 1.2 0.8 0.6 0.5  PROT 8.1 7.8 6.8 6.7  ALBUMIN 3.7 3.6 3.1* 3.0*   Recent Labs  Lab 03/26/19 1051  LIPASE 42   No results for input(s): AMMONIA in the last 168 hours. Cardiac Enzymes: Recent Labs  Lab 03/27/19 0500  CKTOTAL 156   BNP (last 3 results) No results for input(s): BNP in the last 8760 hours. CBG: No results for input(s): GLUCAP in the last 168 hours. Time spent: 35 minutes  Signed:  Lynden Oxfordranav Kyliegh Jester  [facility] 03/29/2019

## 2019-03-31 LAB — CULTURE, BLOOD (ROUTINE X 2)
Culture: NO GROWTH
Culture: NO GROWTH
Special Requests: ADEQUATE

## 2019-04-03 LAB — CULTURE, BLOOD (SINGLE)
Culture: NO GROWTH
Special Requests: ADEQUATE

## 2019-04-13 ENCOUNTER — Other Ambulatory Visit: Payer: Self-pay | Admitting: *Deleted

## 2019-04-13 ENCOUNTER — Telehealth: Payer: Self-pay | Admitting: *Deleted

## 2019-04-13 DIAGNOSIS — K219 Gastro-esophageal reflux disease without esophagitis: Secondary | ICD-10-CM

## 2019-04-13 MED ORDER — FAMOTIDINE 10 MG PO TABS: 10.0000 mg | ORAL_TABLET | Freq: Two times a day (BID) | ORAL | 0 refills | Status: DC

## 2019-04-13 NOTE — Telephone Encounter (Signed)
Pt scheduled for virtual tomorrow with Criss Rosales.  Covid + on 03/26/19. Christen Bame, CMA

## 2019-04-13 NOTE — Telephone Encounter (Signed)
Pt wants to know when she was to have a hospital followup.  To PCP. Christen Bame, CMA

## 2019-04-14 ENCOUNTER — Telehealth (INDEPENDENT_AMBULATORY_CARE_PROVIDER_SITE_OTHER): Payer: Medicare Other | Admitting: Family Medicine

## 2019-04-14 ENCOUNTER — Other Ambulatory Visit: Payer: Self-pay

## 2019-04-14 DIAGNOSIS — K219 Gastro-esophageal reflux disease without esophagitis: Secondary | ICD-10-CM | POA: Diagnosis not present

## 2019-04-14 DIAGNOSIS — U071 COVID-19: Secondary | ICD-10-CM

## 2019-04-14 MED ORDER — FAMOTIDINE 10 MG PO TABS: 10.0000 mg | ORAL_TABLET | Freq: Two times a day (BID) | ORAL | 0 refills | Status: DC

## 2019-04-17 DIAGNOSIS — U071 COVID-19: Secondary | ICD-10-CM

## 2019-04-17 HISTORY — DX: COVID-19: U07.1

## 2019-04-17 NOTE — Progress Notes (Signed)
Virtual Visit via Telephone Note  I connected with Gwendolyn Hicks on 04/17/19 at  4:10 PM EDT by telephone and verified that I am speaking with the correct person using two identifiers.  Location: Patient: At home Provider: [facility] clinic   I discussed the limitations, risks, security and privacy concerns of performing an evaluation and management service by telephone and the availability of in person appointments. I also discussed with the patient that there may be a patient responsible charge related to this service. The patient expressed understanding and agreed to proceed.   History of Present Illness: Patient coming in for her post hospital follow-up visit.  She was confirmed coronavirus positive during that visit on July 18.  She says since then she is actually feeling rather well, has no fever symptoms, has no respiratory symptoms.  She said that she is really only calling in for this visit because she was told she needed to have a follow-up visit.  She says that her only complaint is her gastric reflux which she said is still bothersome to her.  She has no chest pain or shortness of breath.  She says that this is completely consistent with her prior episodes of GERD.   Observations/Objective: Speaking in full sentences, no respiratory distress, she is clearheaded and processing thoughts appropriately.  There is no coughing during her exam.  Assessment and Plan: We discussed that she is on a PPI but does not seem to be on a gastric histamine blocker.  We would add a famotidine to her regimen and have her discuss this at an in person visit next week.  Per discussion with preceptor, current hospital policy is 21 days after a positive COVID test and no symptoms for 3 to 5 days before patient can be scheduled in outpatient clinic.  I discussed that with Gwendolyn Hicks and she is going to come in next week which will comply with that timeframe.  She is aware that if she has any respiratory or fever  symptoms that she should not come in and should instead call.  Follow Up Instructions:    I discussed the assessment and treatment plan with the patient. The patient was provided an opportunity to ask questions and all were answered. The patient agreed with the plan and demonstrated an understanding of the instructions.   The patient was advised to call back or seek an in-person evaluation if the symptoms worsen or if the condition fails to improve as anticipated.  I provided 15 minutes of non-face-to-face time during this encounter.   Sherene Sires, DO

## 2019-04-19 ENCOUNTER — Ambulatory Visit: Payer: Medicare Other | Admitting: Family Medicine

## 2019-04-19 NOTE — Progress Notes (Deleted)
   Subjective:    Patient ID: Gwendolyn Hicks, female    DOB: Feb 01, 1956, 63 y.o.   MRN: [id number]   CC: hosp f/u   HPI: Gwendolyn Hicks is a 63 year old female presenting discuss the form:  Hospital follow-up: Recently admitted from 7/18-7/21 for COVID and acute on chronic renal failure.  Creatinine on discharge was 1.4.  During the time she was there her lisinopril-HCTZ and Coreg were held due to AKI.  GERD:   Smoking status reviewed  Review of Systems Per HPI, also denies recent illness, fever, headache, changes in vision, chest pain, shortness of breath, abdominal pain, N/V/D, weakness   Patient Active Problem List   Diagnosis Date Noted  . COVID-19 virus detected 04/17/2019  . Acute renal failure (ARF) ([place]) 03/26/2019  . Chronic pain of both knees 11/25/2016  . Systolic murmur 50/53/9767  . Breast screening 09/06/2013  . Thyroid nodule 06/04/2012  . Thyromegaly 05/26/2012  . Hyperglycemia 09/30/2011  . Chronic kidney disease (CKD), stage III (moderate) ([place]) 04/15/2010  . Hypothyroidism 11/05/2006  . OBESITY, NOS 11/05/2006  . HYPERTENSION, BENIGN SYSTEMIC 11/05/2006  . GASTROESOPHAGEAL REFLUX, NO ESOPHAGITIS 11/05/2006     Objective:  There were no vitals taken for this visit. Vitals and nursing note reviewed  General: NAD, pleasant Cardiac: RRR, normal heart sounds, no murmurs Respiratory: CTAB, normal effort Abdomen: soft, nontender, nondistended Extremities: no edema or cyanosis. WWP. Skin: warm and dry, no rashes noted Neuro: alert and oriented, no focal deficits Psych: normal affect  Assessment & Plan:    No problem-specific Assessment & Plan notes found for this encounter.    [facility] Resident PGY-2

## 2019-05-08 ENCOUNTER — Other Ambulatory Visit: Payer: Self-pay | Admitting: Family Medicine

## 2019-05-08 DIAGNOSIS — I10 Essential (primary) hypertension: Secondary | ICD-10-CM

## 2019-05-09 NOTE — Telephone Encounter (Signed)
I have held patient's coreg per the hospital doctor's recommendation since her blood pressures were lower when she left.  Please call patient and schedule at least a nurse visit to check BP, if she would like to see a doctor for a more complete visit she can do that as well.  Dr. Criss Rosales

## 2019-05-10 NOTE — Telephone Encounter (Signed)
Pt informed of below and appt scheduled.April Zimmerman Rumple, CMA

## 2019-05-18 ENCOUNTER — Ambulatory Visit (INDEPENDENT_AMBULATORY_CARE_PROVIDER_SITE_OTHER): Payer: Medicare Other | Admitting: Family Medicine

## 2019-05-18 ENCOUNTER — Encounter: Payer: Self-pay | Admitting: Family Medicine

## 2019-05-18 ENCOUNTER — Other Ambulatory Visit: Payer: Self-pay

## 2019-05-18 VITALS — BP 118/72 | HR 88 | Wt 234.2 lb

## 2019-05-18 DIAGNOSIS — U071 COVID-19: Secondary | ICD-10-CM

## 2019-05-18 DIAGNOSIS — I10 Essential (primary) hypertension: Secondary | ICD-10-CM

## 2019-05-18 DIAGNOSIS — Z1239 Encounter for other screening for malignant neoplasm of breast: Secondary | ICD-10-CM | POA: Diagnosis not present

## 2019-05-18 DIAGNOSIS — Z1159 Encounter for screening for other viral diseases: Secondary | ICD-10-CM | POA: Insufficient documentation

## 2019-05-18 NOTE — Patient Instructions (Signed)
It was a pleasure to see you today! Thank you for choosing [facility] for your primary care. Gwendolyn Hicks was seen for regular checkup and blood pressure. Come back to the clinic if you need anything and for your Pap smear.  Today her blood pressure was good, we are glad you are feeling well we do not want to change any medicine.  For your gastric reflux we went over which of the medicines you have not picked up yet and the fact you can buy this over-the-counter, if you buy the generic famotidine instead of the brand-name Pepcid you may be able to save some money.  Today we checked up on a number of your long-term maintenance health issues.  We are ordering a hepatitis C screening, we have ordered a screening mammogram, we are going to give you a release of information form so that we can get the information from your latest colonoscopy it looks like this was done at [facility] in 2017.  We also discussed the fact that you are overdue for a Pap smear and you said you want to reschedule to get that done later, please not forget that is you said he wanted a female physician for that to ask for 1 when you to schedule.   Please bring all your medications to every doctors visit   Sign up for My Chart to have easy access to your labs results, and communication with your Primary care physician.     Please check-out at the front desk before leaving the clinic.     Best,  Dr. Sherene Sires FAMILY MEDICINE RESIDENT - PGY3 05/18/2019 2:19 PM

## 2019-05-18 NOTE — Progress Notes (Cosign Needed)
    Subjective:  Gwendolyn Hicks is a 63 y.o. female who presents to the [facility] today with a chief complaint of blood pressure check.   HPI: HYPERTENSION, BENIGN SYSTEMIC Controlled on chronic medications, no chest pain, no concerns at this time.  COVID-19 virus detected Positive on July 18, symptoms completely resolved at this time,  Need for hepatitis C screening test No concerning symptoms, patient consents to screening.  Breast cancer screening Still with no complaints, we discussed the patient is still overdue despite discussing this in the past.  Patient will go to the breast center for imaging.  Objective:  Physical Exam: BP 118/72   Pulse 88   Wt 234 lb 3.2 oz (106.2 kg)   SpO2 100%   BMI 40.20 kg/m   Gen: NAD, conversing comfortably CV: RRR with no murmurs appreciated Pulm: NWOB, CTAB with no crackles, wheezes, or rhonchi MSK: no edema, cyanosis, or clubbing noted Skin: warm, dry Neuro: grossly normal, moves all extremities Psych: Normal affect and thought content  No results found for this or any previous visit (from the past 72 hour(s)).   Assessment/Plan:  HYPERTENSION, BENIGN SYSTEMIC Controlled on chronic medications, no chest pain, no concerns at this time.  No plan to change  COVID-19 virus detected Symptoms completely resolved at this time,  Need for hepatitis C screening test No concerning symptoms, patient consents to screening.  Breast cancer screening Still with no complaints, we discussed the patient is still overdue despite discussing this in the past.  Patient will go to the breast center for imaging.   Sherene Sires, DO FAMILY MEDICINE RESIDENT - PGY3 05/18/2019 2:28 PM

## 2019-05-18 NOTE — Assessment & Plan Note (Signed)
Controlled on chronic medications, no chest pain, no concerns at this time.  No plan to change

## 2019-05-18 NOTE — Assessment & Plan Note (Signed)
No concerning symptoms, patient consents to screening.

## 2019-05-18 NOTE — Assessment & Plan Note (Signed)
Still with no complaints, we discussed the patient is still overdue despite discussing this in the past.  Patient will go to the breast center for imaging.

## 2019-05-18 NOTE — Assessment & Plan Note (Signed)
Symptoms completely resolved at this time,

## 2019-05-19 LAB — HEPATITIS C ANTIBODY: Hep C Virus Ab: <0.1 s/co ratio (ref 0.0–0.9)

## 2019-05-20 ENCOUNTER — Encounter: Payer: Self-pay | Admitting: Family Medicine

## 2019-05-20 NOTE — Addendum Note (Signed)
Addended by: Coral Else on: 05/20/2019 02:37 PM   Modules accepted: Level of Service

## 2019-07-06 ENCOUNTER — Encounter: Payer: Self-pay | Admitting: Family Medicine

## 2019-07-12 DIAGNOSIS — H2523 Age-related cataract, morgagnian type, bilateral: Secondary | ICD-10-CM | POA: Diagnosis not present

## 2019-07-12 DIAGNOSIS — H524 Presbyopia: Secondary | ICD-10-CM | POA: Diagnosis not present

## 2019-07-29 DIAGNOSIS — H2522 Age-related cataract, morgagnian type, left eye: Secondary | ICD-10-CM | POA: Diagnosis not present

## 2019-08-15 ENCOUNTER — Telehealth: Payer: Self-pay | Admitting: Family Medicine

## 2019-08-15 NOTE — Telephone Encounter (Signed)
Surgical Approval placed in fax pile and faxed on 08/15/2019. [facility] called back and said Dr. Criss Rosales did not check one of the boxes at the bottom of the form. Placed form back in Dr. Perley Jain box and will refax after completion.   Routing directly to Dr. Criss Rosales.

## 2019-08-29 NOTE — Telephone Encounter (Signed)
Was this form completed and faxed?   Patient calls stating her eye surgeon needs to know her allergies and if she should come off of aspirin prior to surgery? Or if there is any reason why the patient should not have the eye surgery.   I advised patient to check with her eye MD to see if he received fax and to fax [place] anything else they might need.

## 2019-10-12 ENCOUNTER — Other Ambulatory Visit: Payer: Self-pay | Admitting: Family Medicine

## 2019-10-12 DIAGNOSIS — I10 Essential (primary) hypertension: Secondary | ICD-10-CM

## 2019-10-13 ENCOUNTER — Other Ambulatory Visit: Payer: Self-pay | Admitting: Family Medicine

## 2019-10-13 DIAGNOSIS — I10 Essential (primary) hypertension: Secondary | ICD-10-CM

## 2019-10-18 ENCOUNTER — Telehealth: Payer: Self-pay

## 2019-10-18 DIAGNOSIS — I10 Essential (primary) hypertension: Secondary | ICD-10-CM

## 2019-10-18 NOTE — Telephone Encounter (Signed)
Pt calling nurse line regarding BP medication refills. Patient states that pharmacy said that they were declined. Last refill request states that refill is not appropriate. Patient calling to find out if she needs to be seen or why she is no longer taking medication.   To PCP  Please advise  Gwendolyn Prude, RN

## 2019-10-19 MED ORDER — LISINOPRIL-HYDROCHLOROTHIAZIDE 20-12.5 MG PO TABS: 1.0000 | ORAL_TABLET | Freq: Every day | ORAL | 0 refills | Status: DC

## 2019-10-19 MED ORDER — CARVEDILOL 3.125 MG PO TABS: 3.1250 mg | ORAL_TABLET | Freq: Two times a day (BID) | ORAL | 3 refills | Status: DC

## 2019-10-19 NOTE — Telephone Encounter (Signed)
Spoke to pt. Pt states that she only stopped taking the BP meds for a couple of days. When she was here for her appt she had taken the medication. Pt would like refills as she is real low on the medicine. Please advise Sunday Spillers, CMA

## 2019-10-19 NOTE — Telephone Encounter (Signed)
Patient calling nurse line again to follow up on blood pressure medications.   To PCP  Veronda Prude, RN

## 2019-10-20 NOTE — Telephone Encounter (Signed)
Pt informed. Gwendolyn Hicks, CMA  

## 2019-11-10 ENCOUNTER — Encounter (HOSPITAL_COMMUNITY): Payer: Self-pay

## 2019-11-10 ENCOUNTER — Other Ambulatory Visit: Payer: Self-pay

## 2019-11-10 ENCOUNTER — Emergency Department (HOSPITAL_COMMUNITY)
Admission: EM | Admit: 2019-11-10 | Discharge: 2019-11-10 | Disposition: A | Discharge status: Home / Self Care | Payer: Medicare Other | Attending: Emergency Medicine | Admitting: Emergency Medicine

## 2019-11-10 ENCOUNTER — Emergency Department (HOSPITAL_COMMUNITY): Payer: Medicare Other

## 2019-11-10 DIAGNOSIS — M79605 Pain in left leg: Secondary | ICD-10-CM

## 2019-11-10 DIAGNOSIS — M79662 Pain in left lower leg: Secondary | ICD-10-CM | POA: Insufficient documentation

## 2019-11-10 DIAGNOSIS — Z79899 Other long term (current) drug therapy: Secondary | ICD-10-CM | POA: Diagnosis not present

## 2019-11-10 DIAGNOSIS — E039 Hypothyroidism, unspecified: Secondary | ICD-10-CM | POA: Diagnosis not present

## 2019-11-10 DIAGNOSIS — I10 Essential (primary) hypertension: Secondary | ICD-10-CM | POA: Insufficient documentation

## 2019-11-10 DIAGNOSIS — Z7982 Long term (current) use of aspirin: Secondary | ICD-10-CM | POA: Diagnosis not present

## 2019-11-10 HISTORY — DX: Hypothyroidism, unspecified: E03.9

## 2019-11-10 LAB — URINALYSIS, ROUTINE W REFLEX MICROSCOPIC
Bilirubin Urine: NEGATIVE
Glucose, UA: NEGATIVE mg/dL
Hgb urine dipstick: NEGATIVE
Ketones, ur: NEGATIVE mg/dL
Leukocytes,Ua: NEGATIVE
Nitrite: NEGATIVE
Protein, ur: NEGATIVE mg/dL
Specific Gravity, Urine: 1.011 (ref 1.005–1.030)
pH: 5 (ref 5.0–8.0)

## 2019-11-10 LAB — BASIC METABOLIC PANEL
Anion gap: 10 (ref 5–15)
BUN: 29 mg/dL — ABNORMAL HIGH (ref 8–23)
CO2: 23 mmol/L (ref 22–32)
Calcium: 9.4 mg/dL (ref 8.9–10.3)
Chloride: 103 mmol/L (ref 98–111)
Creatinine, Ser: 1.79 mg/dL — ABNORMAL HIGH (ref 0.44–1.00)
GFR calc Af Amer: 34 mL/min — ABNORMAL LOW (ref >60)
GFR calc non Af Amer: 29 mL/min — ABNORMAL LOW (ref >60)
Glucose, Bld: 132 mg/dL — ABNORMAL HIGH (ref 70–99)
Potassium: 4.4 mmol/L (ref 3.5–5.1)
Sodium: 136 mmol/L (ref 135–145)

## 2019-11-10 LAB — CBC WITH DIFFERENTIAL/PLATELET
Abs Immature Granulocytes: 0.03 10*3/uL (ref 0.00–0.07)
Basophils Absolute: 0.1 10*3/uL (ref 0.0–0.1)
Basophils Relative: 1 %
Eosinophils Absolute: 0 10*3/uL (ref 0.0–0.5)
Eosinophils Relative: 0 %
HCT: 34.4 % — ABNORMAL LOW (ref 36.0–46.0)
Hemoglobin: 11.4 g/dL — ABNORMAL LOW (ref 12.0–15.0)
Immature Granulocytes: 0 %
Lymphocytes Relative: 22 %
Lymphs Abs: 2.1 10*3/uL (ref 0.7–4.0)
MCH: 28.1 pg (ref 26.0–34.0)
MCHC: 33.1 g/dL (ref 30.0–36.0)
MCV: 84.9 fL (ref 80.0–100.0)
Monocytes Absolute: 0.5 10*3/uL (ref 0.1–1.0)
Monocytes Relative: 6 %
Neutro Abs: 6.8 10*3/uL (ref 1.7–7.7)
Neutrophils Relative %: 71 %
Platelets: 233 10*3/uL (ref 150–400)
RBC: 4.05 MIL/uL (ref 3.87–5.11)
RDW: 12.7 % (ref 11.5–15.5)
WBC: 9.5 10*3/uL (ref 4.0–10.5)
nRBC: 0 % (ref 0.0–0.2)

## 2019-11-10 MED ORDER — PREDNISONE 20 MG PO TABS: 40.0000 mg | ORAL_TABLET | Freq: Every day | ORAL | 0 refills | Status: DC

## 2019-11-10 MED ORDER — DEXAMETHASONE SODIUM PHOSPHATE 10 MG/ML IJ SOLN
10.0000 mg | Freq: Once | INTRAMUSCULAR | Status: AC
  Administered 2019-11-10: 15:00:00 10 mg via INTRAVENOUS
  Filled 2019-11-10: qty 1

## 2019-11-10 MED ORDER — DICLOFENAC EPOLAMINE 1.3 % EX PTCH
1.0000 | MEDICATED_PATCH | Freq: Two times a day (BID) | CUTANEOUS | Status: DC
  Administered 2019-11-10: 16:00:00 1 via TRANSDERMAL
  Filled 2019-11-10 (×2): qty 1

## 2019-11-10 MED ORDER — KETOROLAC TROMETHAMINE 15 MG/ML IJ SOLN
15.0000 mg | Freq: Once | INTRAMUSCULAR | Status: AC
  Administered 2019-11-10: 15 mg via INTRAVENOUS
  Filled 2019-11-10: qty 1

## 2019-11-10 NOTE — Discharge Instructions (Addendum)
As discussed, today's evaluation has been generally reassuring.  You are likely suffering from sciatica, or inflammation of the nerve that gives a sensation from your leg.  Please take all medication as directed and be sure to follow-up with your physician.  In addition to the prescribed steroids, please obtain topical medication patches from any local pharmacy that include the ingredients lidocaine and methyl salicylate.  Use them as directed, typically twice daily for the next week.  They should be applied in the area just to the side of your spine in your lower back  Return here for any concerning changes.  In addition, your tests suggest that you are mildly dehydrated, so please be sure to drink plenty of fluids.

## 2019-11-10 NOTE — ED Provider Notes (Signed)
[facility]-EMERGENCY DEPT Provider Note   CSN: [id number] Arrival date & time: 11/10/19  1400     History Chief Complaint  Patient presents with  . Leg Pain    left hip intermittent muscle pain x 5 days    Gwendolyn Hicks is a 64 y.o. female.  HPI Patient presents with concern of hip pain. Without clear precipitant, about 1 week ago the patient developed pain in her left buttock, radiating down the posterior left leg.  Patient has no true urinary complaints, but does have new difficulty with ambulation making trips to the bathroom difficult.  No fall, no trauma, no abdominal pain, low back pain, no fever, no chills, no urinary complaints.  Is unclear if she has been taking any medication for pain relief. Past Medical History:  Diagnosis Date  . Anemia   . COVID-19 virus detected 04/17/2019  . Depression   . GERD (gastroesophageal reflux disease)   . Hypertension   . Hypothyroidism     Patient Active Problem List   Diagnosis Date Noted  . Need for hepatitis C screening test 05/18/2019  . Acute renal failure (ARF) ([facility]) 03/26/2019  . Chronic pain of both knees 11/25/2016  . Systolic murmur 01/25/2016  . Breast cancer screening 09/06/2013  . Thyroid nodule 06/04/2012  . Thyromegaly 05/26/2012  . Hyperglycemia 09/30/2011  . Chronic kidney disease (CKD), stage III (moderate) 04/15/2010  . Hypothyroidism 11/05/2006  . OBESITY, NOS 11/05/2006  . HYPERTENSION, BENIGN SYSTEMIC 11/05/2006  . GASTROESOPHAGEAL REFLUX, NO ESOPHAGITIS 11/05/2006    Past Surgical History:  Procedure Laterality Date  . CESAREAN SECTION     2     OB History   No obstetric history on file.     Family History  Problem Relation Age of Onset  . Hypertension Mother   . Heart disease Mother   . Hypertension Father   . Heart disease Father     Social History   Tobacco Use  . Smoking status: Never Smoker  . Smokeless tobacco: Never Used  Substance Use Topics  . Alcohol  use: No  . Drug use: No    Home Medications Prior to Admission medications   Medication Sig Start Date End Date Taking? Authorizing Provider  aspirin (ANACIN) 81 MG EC tablet Take 81 mg by mouth daily.    Yes [provider]  carvedilol (COREG) 3.125 MG tablet Take 1 tablet (3.125 mg total) by mouth 2 (two) times daily with a meal. 10/19/19  Yes Marthenia Rolling, DO  levothyroxine (SYNTHROID) 100 MCG tablet Take 1 tablet (100 mcg total) by mouth daily. 03/07/19  Yes Shon Hale, MD  lisinopril-hydrochlorothiazide (ZESTORETIC) 20-12.5 MG tablet Take 1 tablet by mouth daily. 10/19/19  Yes Marthenia Rolling, DO  omeprazole (PRILOSEC OTC) 20 MG tablet Take 20 mg by mouth 2 (two) times a day.   Yes [provider]  famotidine (PEPCID) 10 MG tablet Take 1 tablet (10 mg total) by mouth 2 (two) times daily. Patient not taking: Reported on 11/10/2019 04/14/19 11/10/19  Marthenia Rolling, DO  Misc. Devices (PULSE OXIMETER) MISC 1 Act by Does not apply route as needed. 03/29/19   Rolly Salter, MD  Thermometer MISC 1 Act by Does not apply route as needed. 03/29/19   Rolly Salter, MD  vitamin C (ASCORBIC ACID) 500 MG tablet Take 0.5 tablets (250 mg total) by mouth daily. Patient not taking: Reported on 11/10/2019 03/29/19   Rolly Salter, MD  Zinc 220 (  50 Zn) MG CAPS Take 1 tablet by mouth daily. Patient not taking: Reported on 11/10/2019 03/29/19   Rolly Salter, MD    Allergies    Diphenhydramine hcl  Review of Systems   Review of Systems  Constitutional:       Per HPI, otherwise negative  HENT:       Per HPI, otherwise negative  Respiratory:       Per HPI, otherwise negative  Cardiovascular:       Per HPI, otherwise negative  Gastrointestinal: Negative for vomiting.  Endocrine:       Negative aside from HPI  Genitourinary:       Neg aside from HPI   Musculoskeletal:       Per HPI, otherwise negative  Skin: Negative.   Neurological: Negative for syncope.    Physical Exam  Updated Vital Signs BP 136/66   Pulse 84   Temp 98 F (36.7 C) (Oral)   SpO2 100%   Physical Exam Vitals and nursing note reviewed.  Constitutional:      General: She is not in acute distress.    Appearance: She is well-developed.     Comments: Uncomfortable appearing obese adult female awake and alert sitting upright on the edge of her bed speaking clearly.  HENT:     Head: Normocephalic and atraumatic.  Eyes:     Conjunctiva/sclera: Conjunctivae normal.  Cardiovascular:     Rate and Rhythm: Normal rate and regular rhythm.  Pulmonary:     Effort: Pulmonary effort is normal. No respiratory distress.     Breath sounds: Normal breath sounds. No stridor.  Abdominal:     General: There is no distension.     Tenderness: There is no abdominal tenderness. There is no guarding.  Musculoskeletal:     Comments: No gross deformities, the patient does move all extremities spontaneously including her left/affected leg.  Skin:    General: Skin is warm and dry.  Neurological:     Mental Status: She is alert and oriented to person, place, and time.     Cranial Nerves: No cranial nerve deficit.  Psychiatric:        Mood and Affect: Mood normal.     ED Results / Procedures / Treatments   Labs (all labs ordered are listed, but only abnormal results are displayed) Labs Reviewed  BASIC METABOLIC PANEL - Abnormal; Notable for the following components:      Result Value   Glucose, Bld 132 (*)    BUN 29 (*)    Creatinine, Ser 1.79 (*)    GFR calc non Af Amer 29 (*)    GFR calc Af Amer 34 (*)    All other components within normal limits  CBC WITH DIFFERENTIAL/PLATELET - Abnormal; Notable for the following components:   Hemoglobin 11.4 (*)    HCT 34.4 (*)    All other components within normal limits  URINALYSIS, ROUTINE W REFLEX MICROSCOPIC    EKG None  Radiology DG Hip Unilat With Pelvis 2-3 Views Left  Result Date: 11/10/2019 CLINICAL DATA:  Left hip intermittent spasm over the  last 5 days. EXAM: DG HIP (WITH OR WITHOUT PELVIS) 2-3V LEFT COMPARISON:  None. FINDINGS: There is no evidence of hip fracture or dislocation. There is no evidence of arthropathy or other focal bone abnormality. Artifact relates to an overlying pain patch. IMPRESSION: Negative. Electronically Signed   By: Paulina Fusi M.D.   On: 11/10/2019 15:35    Procedures Procedures (including critical  care time)  Medications Ordered in ED Medications  diclofenac (FLECTOR) 1.3 % 1 patch (1 patch Transdermal Patch Applied 11/10/19 1531)  ketorolac (TORADOL) 15 MG/ML injection 15 mg (15 mg Intravenous Given 11/10/19 1439)  dexamethasone (DECADRON) injection 10 mg (10 mg Intravenous Given 11/10/19 1439)    ED Course  I have reviewed the triage vital signs and the nursing notes.  Pertinent labs & imaging results that were available during my care of the patient were reviewed by me and considered in my medical decision making (see chart for details).    MDM Rules/Calculators/A&P                      4:56 PM Patient awake and alert, states that she feels substantially better. Labs reviewed, notable for slightly diminished renal function, though not substantially far from baseline.  There is some elevation in BUN suggesting possible etiology for malodorous urine that she complained of, but no evidence for urinary tract infection, kidney stone.  With reassuring improvement here, no focal neurologic deficiency, and labs as above, no evidence for stone, [place], patient is appropriate for discharge with outpatient follow-up. Final Clinical Impression(s) / ED Diagnoses Final diagnoses:  Left leg pain    Rx / DC Orders ED Discharge Orders         Ordered    predniSONE (DELTASONE) 20 MG tablet  Daily with breakfast     11/10/19 1658           Carmin Muskrat, MD 11/10/19 1659

## 2019-11-10 NOTE — ED Triage Notes (Signed)
Pt c/o left hip intermittent spasm x 5 days with no relief with heat, patch, salve.

## 2019-11-18 ENCOUNTER — Ambulatory Visit (INDEPENDENT_AMBULATORY_CARE_PROVIDER_SITE_OTHER): Payer: Medicare Other

## 2019-11-18 ENCOUNTER — Other Ambulatory Visit: Payer: Self-pay

## 2019-11-18 DIAGNOSIS — Z0000 Encounter for general adult medical examination without abnormal findings: Secondary | ICD-10-CM | POA: Diagnosis not present

## 2019-11-18 NOTE — Progress Notes (Addendum)
Subjective:   Gwendolyn Hicks is a 64 y.o. female who presents for Medicare Annual (Subsequent) preventive examination.  The patient consented to a virtual visit.  Review of Systems: Defer to PCP.  Cardiac Risk Factors include: hypertension  Objective:   Vitals: There were no vitals taken for this visit.  There is no height or weight on file to calculate BMI.  Advanced Directives 11/18/2019 03/27/2019 03/27/2019 03/26/2019 02/16/2018 11/25/2016 10/14/2016  Does Patient Have a Medical Advance Directive? No No No No No No No  Would patient like information on creating a medical advance directive? Yes (MAU/Ambulatory/Procedural Areas - Information given) - No - Patient declined No - Patient declined No - Patient declined Yes (MAU/Ambulatory/Procedural Areas - Information given) No - Patient declined    Tobacco Social History   Tobacco Use  Smoking Status Never Smoker  Smokeless Tobacco Never Used     Counseling given: Not Answered   Clinical Intake:  Pre-visit preparation completed: Yes  Pain Score: 0-No pain        How often do you need to have someone help you when you read instructions, pamphlets, or other written materials from your doctor or pharmacy?: 3 - Sometimes What is the last grade level you completed in school?: 9th grade  Interpreter Needed?: No     Past Medical History:  Diagnosis Date  . Anemia   . COVID-19 virus detected 04/17/2019  . Depression   . GERD (gastroesophageal reflux disease)   . Hypertension   . Hypothyroidism    Past Surgical History:  Procedure Laterality Date  . CESAREAN SECTION     2  . EYE SURGERY     Family History  Problem Relation Age of Onset  . Hypertension Mother   . Heart disease Mother   . Hypertension Father   . Heart disease Father   . Hypertension Sister   . Hypertension Brother   . Hypertension Daughter   . Hypertension Son    Social History   Socioeconomic History  . Marital status: Widowed    Spouse name:  Caro Hight  . Number of children: 6  . Years of education: 67  . Highest education level: Not on file  Occupational History  . Occupation: Retired-Janitor/dry cleaning  Tobacco Use  . Smoking status: Never Smoker  . Smokeless tobacco: Never Used  Substance and Sexual Activity  . Alcohol use: No  . Drug use: No  . Sexual activity: Not Currently    Comment: husband deceased 2016/09/10  Other Topics Concern  . Not on file  Social History Narrative   Patient lives in [place] with her son and daughter in law and their 3 children.    Patients husband passed away September 10, 2016.   Patient enjoys fishing, watching TV, spending time with family, and being an active church member.             Social Determinants of Health   Financial Resource Strain: Low Risk   . Difficulty of Paying Living Expenses: Not very hard  Food Insecurity: No Food Insecurity  . Worried About Programme researcher, broadcasting/film/video in the Last Year: Never true  . Ran Out of Food in the Last Year: Never true  Transportation Needs: No Transportation Needs  . Lack of Transportation (Medical): No  . Lack of Transportation (Non-Medical): No  Physical Activity: Insufficiently Active  . Days of Exercise per Week: 2 days  . Minutes of Exercise per Session: 20 min  Stress: No Stress Concern Present  .  Feeling of Stress : Only a little  Social Connections: Slightly Isolated  . Frequency of Communication with Friends and Family: More than three times a week  . Frequency of Social Gatherings with Friends and Family: More than three times a week  . Attends Religious Services: More than 4 times per year  . Active Member of Clubs or Organizations: Yes  . Attends Banker Meetings: More than 4 times per year  . Marital Status: Widowed    Outpatient Encounter Medications as of 11/18/2019  Medication Sig  . aspirin (ANACIN) 81 MG EC tablet Take 81 mg by mouth daily.   . carvedilol (COREG) 3.125 MG tablet Take 1 tablet (3.125 mg total) by  mouth 2 (two) times daily with a meal.  . levothyroxine (SYNTHROID) 100 MCG tablet Take 1 tablet (100 mcg total) by mouth daily.  . famotidine (PEPCID) 10 MG tablet Take 1 tablet (10 mg total) by mouth 2 (two) times daily. (Patient not taking: Reported on 11/10/2019)  . lisinopril-hydrochlorothiazide (ZESTORETIC) 20-12.5 MG tablet Take 1 tablet by mouth daily.  . Misc. Devices (PULSE OXIMETER) MISC 1 Act by Does not apply route as needed.  Marland Kitchen omeprazole (PRILOSEC OTC) 20 MG tablet Take 20 mg by mouth 2 (two) times a day.  . predniSONE (DELTASONE) 20 MG tablet Take 2 tablets (40 mg total) by mouth daily with breakfast. For the next four days (Patient not taking: Reported on 11/18/2019)  . Thermometer MISC 1 Act by Does not apply route as needed.  . vitamin C (ASCORBIC ACID) 500 MG tablet Take 0.5 tablets (250 mg total) by mouth daily. (Patient not taking: Reported on 11/10/2019)  . Zinc 220 (50 Zn) MG CAPS Take 1 tablet by mouth daily. (Patient not taking: Reported on 11/10/2019)   No facility-administered encounter medications on file as of 11/18/2019.    Activities of Daily Living In your present state of health, do you have any difficulty performing the following activities: 11/18/2019 03/27/2019  Hearing? N N  Vision? Y Y  Comment recent eye surgery -  Difficulty concentrating or making decisions? N N  Walking or climbing stairs? Y N  Dressing or bathing? N N  Doing errands, shopping? N N  Comment patient does enjoy the company of bringing someone with her -  Quarry manager and eating ? N -  Using the Toilet? N -  In the past six months, have you accidently leaked urine? N -  Do you have problems with loss of bowel control? N -  Managing your Medications? N -  Managing your Finances? N -  Housekeeping or managing your Housekeeping? N -  Some recent data might be hidden   Patient Care Team: Marthenia Rolling, DO as PCP - General Graylin Shiver, MD as Consulting Physician  (Gastroenterology) Sinda Du, MD as Consulting Physician (Ophthalmology)    Assessment:   This is a routine wellness examination for [facility].  Exercise Activities and Dietary recommendations Current Exercise Habits: Home exercise routine, Type of exercise: walking, Time (Minutes): 20, Frequency (Times/Week): 2, Weekly Exercise (Minutes/Week): 40  Goals    . Exercise 3x per week (30 min per time)     You are walking 1-2 days per week, this is great! Lets try to add 1 or more days to your routine!       Fall Risk Fall Risk  11/18/2019 05/18/2019 03/07/2019 11/25/2016 01/25/2016  Falls in the past year? 0 0 0 No No  Number falls in past yr: -  0 0 - -  Risk for fall due to : - - - - -  Follow up - Falls evaluation completed Falls evaluation completed - -   Is the patient's home free of loose throw rugs in walkways, pet beds, electrical cords, etc?   yes      Grab bars in the bathroom? no      Handrails on the stairs?   yes      Adequate lighting?   yes  Patient rating of health (0-10) scale: 7   Depression Screen PHQ 2/9 Scores 11/18/2019 11/18/2019 05/18/2019 03/07/2019  PHQ - 2 Score 2 2 0 0  PHQ- 9 Score - - - -    Cognitive Function MMSE - Mini Mental State Exam 08/25/2011  Orientation to time 5  Orientation to Place 5  Registration 3  Attention/ Calculation 2  Recall 1  Language- name 2 objects 2  Language- repeat 1  Language- follow 3 step command 3  Language- read & follow direction 1  Write a sentence 1  Copy design 0  Total score 24   6CIT Screen 11/18/2019  What Year? 0 points  What month? 0 points  What time? 0 points  Count back from 20 0 points  Months in reverse 2 points  Repeat phrase 4 points  Total Score 6   Immunization History  Administered Date(s) Administered  . Td 11/06/2001   Screening Tests Health Maintenance  Topic Date Due  . PAP SMEAR-Modifier  01/07/2008  . TETANUS/TDAP  11/07/2011  . MAMMOGRAM  11/18/2013  . INFLUENZA VACCINE   12/07/2019 (Originally 04/09/2019)  . COLONOSCOPY  10/09/2025  . Hepatitis C Screening  Completed  . HIV Screening  Completed    Cancer Screenings: Lung: Low Dose CT Chest recommended if Age 65-80 years, 30 pack-year currently smoking OR have quit w/in 15years. Patient does not qualify. Breast:  Up to date on Mammogram? No   Up to date of Bone Density/Dexa? No Colorectal: UTD 10/10/2015  Additional Screenings: Hepatitis C Screening: Completed  HIV Screening: Completed   Plan:   We have scheduled you to see your PCP on April 1st.  Fill out an advance directive packet at PCP visit. [facility] schedule your mamm[facility]. At 1 you will be due for a DEXA scan.  Call the office to schedule with a female provider for your pap smear.  Consider getting your COVID Vaccine!  Add 1-2 days to your weekly walking routine!  I have personally reviewed and noted the following in the patient's chart:   . Medical and social history . Use of alcohol, tobacco or illicit drugs  . Current medications and supplements . Functional ability and status . Nutritional status . Physical activity . Advanced directives . List of other physicians . Hospitalizations, surgeries, and ER visits in previous 12 months . Vitals . Screenings to include cognitive, depression, and falls . Referrals and appointments  In addition, I have reviewed and discussed with patient certain preventive protocols, quality metrics, and best practice recommendations. A written personalized care plan for preventive services as well as general preventive health recommendations were provided to patient.  This visit was conducted virtually in the setting of the [place] pandemic.    Dorna Bloom, [place]  11/18/2019    I have reviewed this visit and agree with the documentation.  -Dr. Criss Rosales

## 2019-11-18 NOTE — Patient Instructions (Addendum)
You spoke to Gwendolyn Hicks, CMA over the phone for your annual wellness visit.  We discussed goals: Goals    . Exercise 3x per week (30 min per time)     You are walking 1-2 days per week, this is great! Lets try to add 1 or more days to your routine!       We also discussed recommended health maintenance. As discussed, you are due for a pap smear and mammogram. We have scheduled you to see your PCP in April.   Health Maintenance  Topic Date Due  . PAP SMEAR-Modifier  01/07/2008  . TETANUS/TDAP  11/07/2011  . MAMMOGRAM  11/18/2013  . INFLUENZA VACCINE  12/07/2019 (Originally 04/09/2019)  . COLONOSCOPY  10/09/2025  . Hepatitis C Screening  Completed  . HIV Screening  Completed   We have scheduled you to see your PCP on April 1st.  Fill out an advance directive packet at PCP visit. [facility] schedule your mamm[facility]. At 59 you will be due for a DEXA scan.  Call the office to schedule with a female provider for your pap smear.  Consider getting your COVID Vaccine!  Add 1-2 days to your weekly walking routine!  Health Maintenance, Female Adopting a healthy lifestyle and getting preventive care are important in promoting health and wellness. Ask your health care provider about:  The right schedule for you to have regular tests and exams.  Things you can do on your own to prevent diseases and keep yourself healthy. What should I know about diet, weight, and exercise? Eat a healthy diet   Eat a diet that includes plenty of vegetables, fruits, low-fat dairy products, and lean protein.  Do not eat a lot of foods that are high in solid fats, added sugars, or sodium. Maintain a healthy weight Body mass index (BMI) is used to identify weight problems. It estimates body fat based on height and weight. Your health care provider can help determine your BMI and help you achieve or maintain a healthy weight. Get regular exercise Get regular exercise. This is one of the most  important things you can do for your health. Most adults should:  Exercise for at least 150 minutes each week. The exercise should increase your heart rate and make you sweat (moderate-intensity exercise).  Do strengthening exercises at least twice a week. This is in addition to the moderate-intensity exercise.  Spend less time sitting. Even light physical activity can be beneficial. Watch cholesterol and blood lipids Have your blood tested for lipids and cholesterol at 65 years of age, then have this test every 5 years. Have your cholesterol levels checked more often if:  Your lipid or cholesterol levels are high.  You are older than 66 years of age.  You are at high risk for heart disease. What should I know about cancer screening? Depending on your health history and family history, you may need to have cancer screening at various ages. This may include screening for:  Breast cancer.  Cervical cancer.  Colorectal cancer.  Skin cancer.  Lung cancer. What should I know about heart disease, diabetes, and high blood pressure? Blood pressure and heart disease  High blood pressure causes heart disease and increases the risk of stroke. This is more likely to develop in people who have high blood pressure readings, are of African descent, or are overweight.  Have your blood pressure checked: ? Every 3-5 years if you are 85-67 years of age. ? Every  year if you are 1 years old or older. Diabetes Have regular diabetes screenings. This checks your fasting blood sugar level. Have the screening done:  Once every three years after age 35 if you are at a normal weight and have a low risk for diabetes.  More often and at a younger age if you are overweight or have a high risk for diabetes. What should I know about preventing infection? Hepatitis B If you have a higher risk for hepatitis B, you should be screened for this virus. Talk with your health care provider to find out if you are  at risk for hepatitis B infection. Hepatitis C Testing is recommended for:  Everyone born from [birth year] through 1966.  Anyone with known risk factors for hepatitis C. Sexually transmitted infections (STIs)  Get screened for STIs, including gonorrhea and chlamydia, if: ? You are sexually active and are younger than 65 years of age. ? You are older than 65 years of age and your health care provider tells you that you are at risk for this type of infection. ? Your sexual activity has changed since you were last screened, and you are at increased risk for chlamydia or gonorrhea. Ask your health care provider if you are at risk.  Ask your health care provider about whether you are at high risk for HIV. Your health care provider may recommend a prescription medicine to help prevent HIV infection. If you choose to take medicine to prevent HIV, you should first get tested for HIV. You should then be tested every 3 months for as long as you are taking the medicine. Pregnancy  If you are about to stop having your period (premenopausal) and you may become pregnant, seek counseling before you get pregnant.  Take 400 to 800 micrograms (mcg) of folic acid every day if you become pregnant.  Ask for birth control (contraception) if you want to prevent pregnancy. Osteoporosis and menopause Osteoporosis is a disease in which the bones lose minerals and strength with aging. This can result in bone fractures. If you are 63 years old or older, or if you are at risk for osteoporosis and fractures, ask your health care provider if you should:  Be screened for bone loss.  Take a calcium or vitamin D supplement to lower your risk of fractures.  Be given hormone replacement therapy (HRT) to treat symptoms of menopause. Follow these instructions at home: Lifestyle  Do not use any products that contain nicotine or tobacco, such as cigarettes, e-cigarettes, and chewing tobacco. If you need help quitting, ask your  health care provider.  Do not use street drugs.  Do not share needles.  Ask your health care provider for help if you need support or information about quitting drugs. Alcohol use  Do not drink alcohol if: ? Your health care provider tells you not to drink. ? You are pregnant, may be pregnant, or are planning to become pregnant.  If you drink alcohol: ? Limit how much you use to 0-1 drink a day. ? Limit intake if you are breastfeeding.  Be aware of how much alcohol is in your drink. In the [place] one drink equals one 12 oz bottle of beer (355 mL), one 5 oz glass of wine (148 mL), or one 1 oz glass of hard liquor (44 mL). General instructions  Schedule regular health, dental, and eye exams.  Stay current with your vaccines.  Tell your health care provider if: ? You often feel depressed. ? You  have ever been abused or do not feel safe at home. Summary  Adopting a healthy lifestyle and getting preventive care are important in promoting health and wellness.  Follow your health care provider's instructions about healthy diet, exercising, and getting tested or screened for diseases.  Follow your health care provider's instructions on monitoring your cholesterol and blood pressure. This information is not intended to replace advice given to you by your health care provider. Make sure you discuss any questions you have with your health care provider. Document Revised: 08/18/2018 Document Reviewed: 08/18/2018 Elsevier Patient Education  2020 [place] [facility]'s number is [phone]. Please call with questions or concerns about what we discussed today.

## 2019-12-08 ENCOUNTER — Telehealth (INDEPENDENT_AMBULATORY_CARE_PROVIDER_SITE_OTHER): Payer: Medicare Other | Admitting: Family Medicine

## 2019-12-08 ENCOUNTER — Encounter: Payer: Self-pay | Admitting: Family Medicine

## 2019-12-08 ENCOUNTER — Other Ambulatory Visit: Payer: Self-pay

## 2019-12-08 DIAGNOSIS — Z20822 Contact with and (suspected) exposure to covid-19: Secondary | ICD-10-CM | POA: Diagnosis not present

## 2019-12-08 NOTE — Assessment & Plan Note (Signed)
Patient says he was around someone 4 days ago was confirmed positive.  Wanted to check on guidelines for self isolating as it is difficult for to arrange transportation to testing.  Said that she feels symptomatically 100% at baseline with no symptoms concerning for Covid at this time.  We did discuss the importance of isolating even within the house and went over emergency precautions for isolating at home as she did not think she was going to be able to arrange transportation to get tested

## 2019-12-08 NOTE — Progress Notes (Signed)
[facility] Telemedicine Visit  Patient consented to have virtual visit. Method of visit: Telephone  Encounter participants: Patient: Gwendolyn Hicks - located at home Provider: Marthenia Rolling - located at [facility]  Chief Complaint: Exposure to Covid  HPI:  Patient says she was exposed to someone who since been tested positive for Covid.  She was around them 4 days ago.  She said that she feels completely fine has had no symptoms whatsoever, she denies change in smell or taste, cough, shortness of breath, fevers.  She says that she would prefer to self isolate at home as it is difficult to arrange transportation to get to a testing center and she has no plans to be anywhere for the next 2 weeks  ROS: per HPI  Pertinent PMHx: Hypertension, obesity,  Exam:  Respiratory: No exam possible as patient was on the phone, no respiratory distress and no cough, was speaking in full sentences the entire time and mentating at baseline  Assessment/Plan:  Exposure to COVID-19 virus Patient says he was around someone 4 days ago was confirmed positive.  Wanted to check on guidelines for self isolating as it is difficult for to arrange transportation to testing.  Said that she feels symptomatically 100% at baseline with no symptoms concerning for Covid at this time.  We did discuss the importance of isolating even within the house and went over emergency precautions for isolating at home as she did not think she was going to be able to arrange transportation to get tested    Time spent during visit with patient: 9 minutes

## 2020-02-15 ENCOUNTER — Other Ambulatory Visit: Payer: Self-pay | Admitting: Family Medicine

## 2020-02-15 DIAGNOSIS — I10 Essential (primary) hypertension: Secondary | ICD-10-CM

## 2020-03-09 ENCOUNTER — Other Ambulatory Visit: Payer: Self-pay

## 2020-03-09 MED ORDER — LEVOTHYROXINE SODIUM 100 MCG PO TABS: 100.0000 ug | ORAL_TABLET | Freq: Every day | ORAL | 3 refills | Status: DC

## 2020-04-20 ENCOUNTER — Ambulatory Visit: Payer: Medicare Other | Admitting: Family Medicine

## 2020-06-08 ENCOUNTER — Other Ambulatory Visit: Payer: Self-pay | Admitting: Family Medicine

## 2020-06-08 DIAGNOSIS — I10 Essential (primary) hypertension: Secondary | ICD-10-CM

## 2020-10-16 IMAGING — DX DG HIP (WITH OR WITHOUT PELVIS) 2-3V*L*
3 series · 3 of 3 positions shown · non-contrast
Comparison: None.

CLINICAL DATA: Left hip intermittent spasm over the last 5 days.

EXAM:
DG HIP (WITH OR WITHOUT PELVIS) 2-3V LEFT

[pelvis ap]
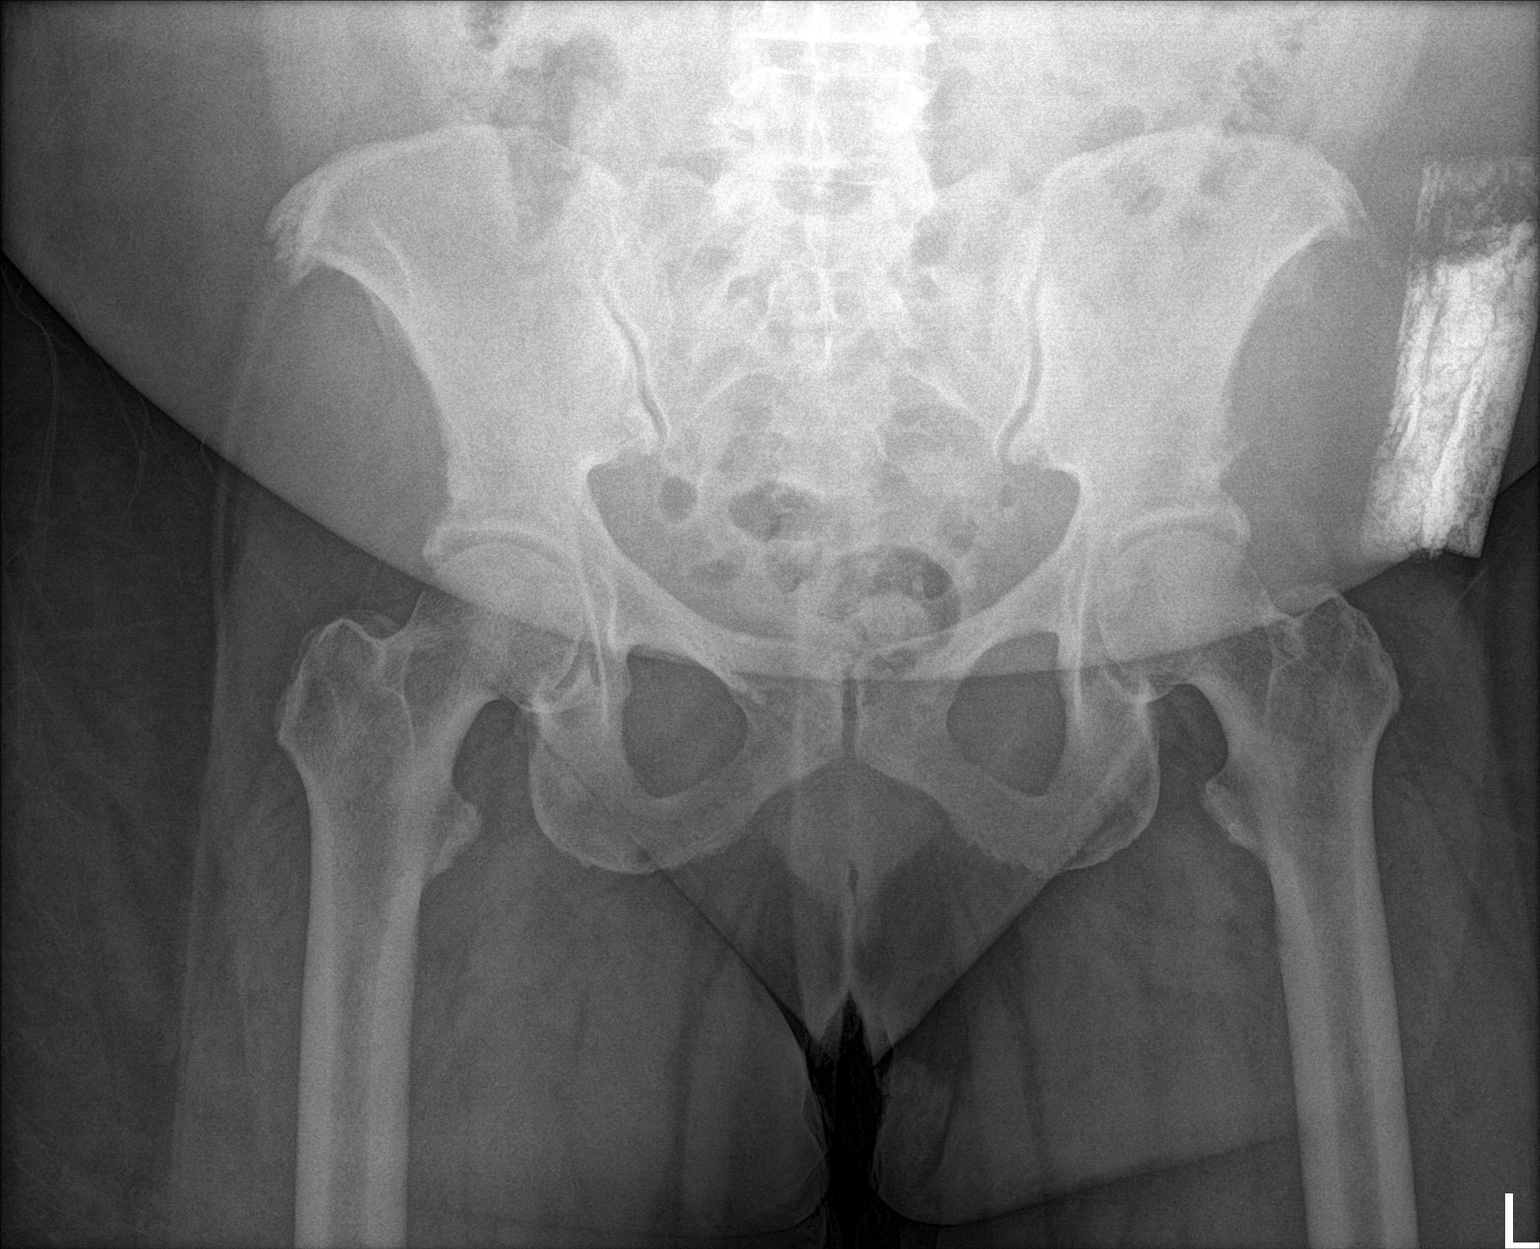

[hip ap]
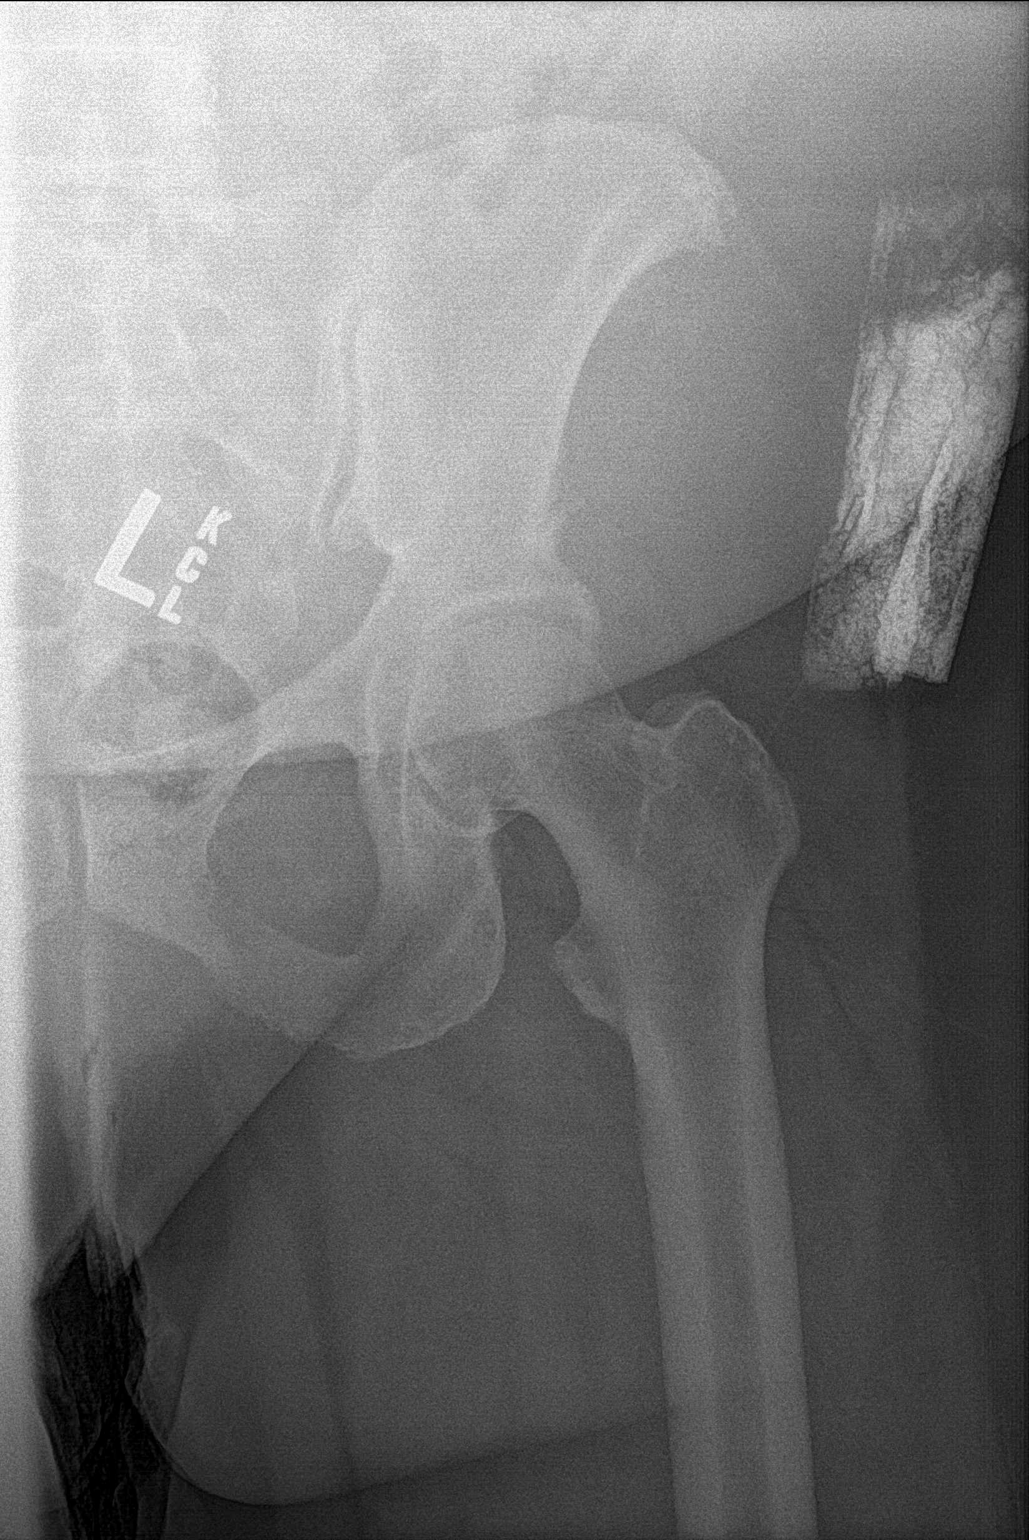

[hip lat]
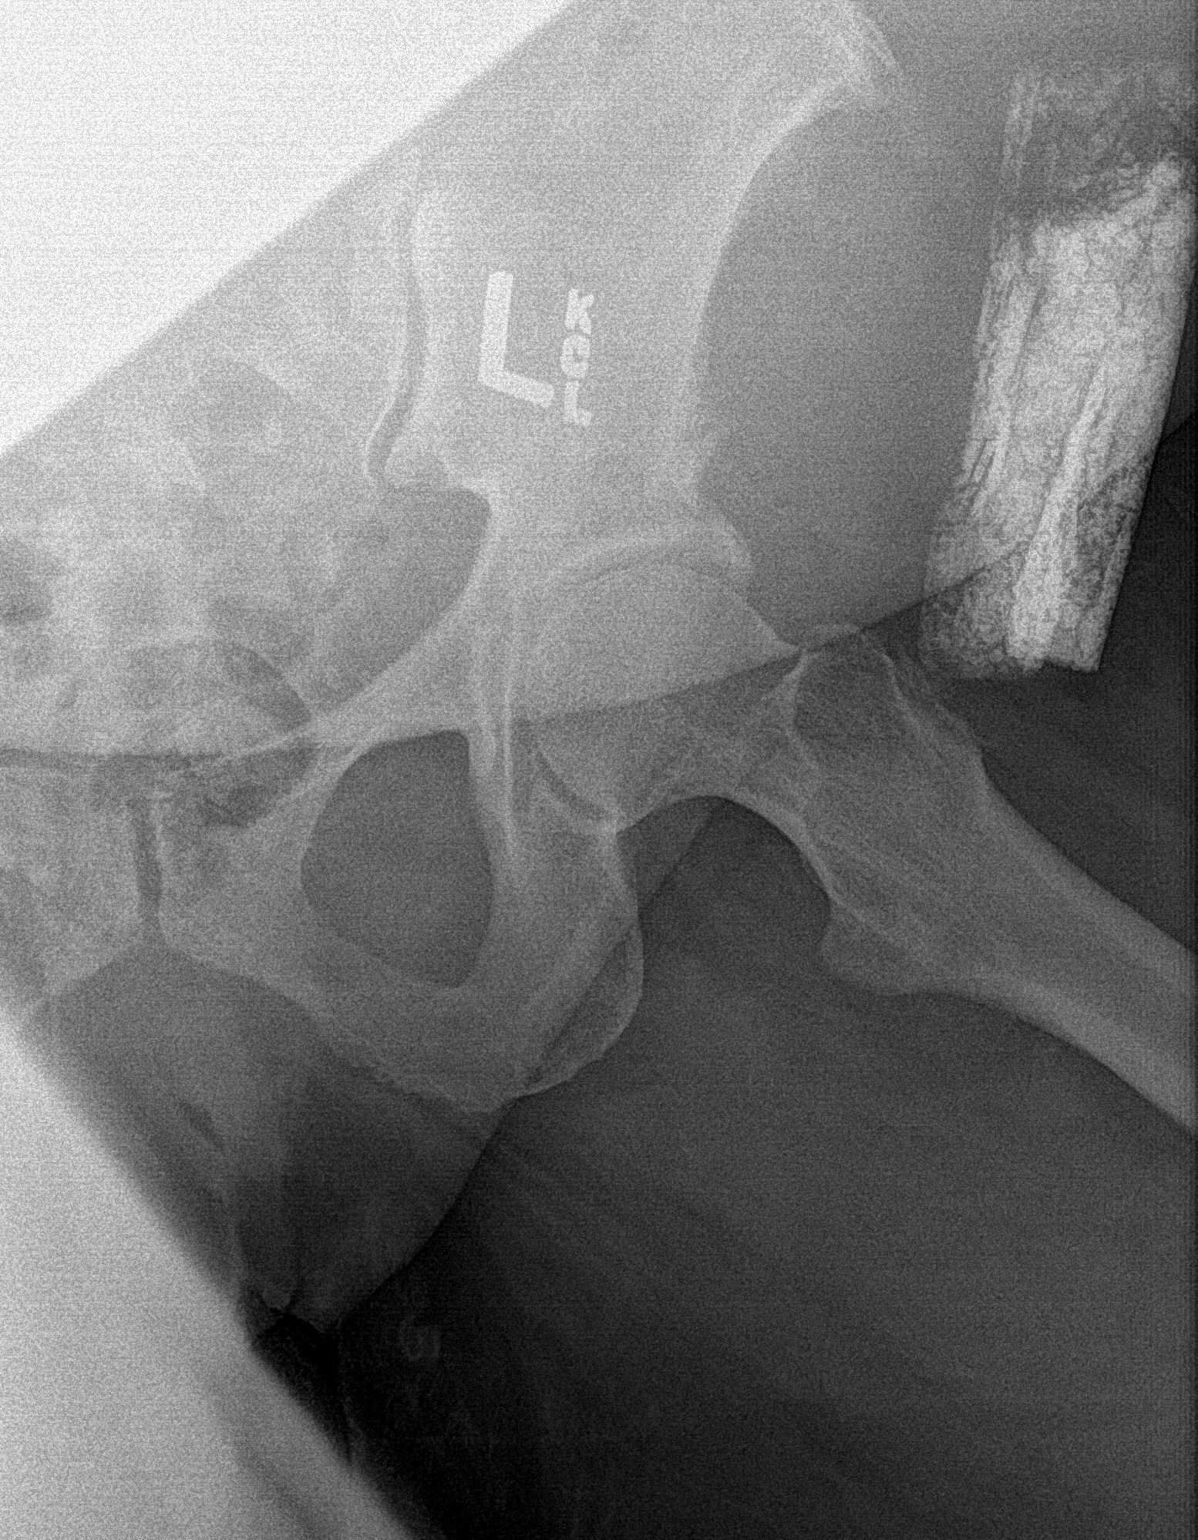

[3 of 3 positions shown; findings below may reference images not displayed]

FINDINGS: There is no evidence of hip fracture or dislocation. There is no
evidence of arthropathy or other focal bone abnormality. Artifact
relates to an overlying pain patch.
IMPRESSION: Negative.

## 2020-10-19 DIAGNOSIS — H2521 Age-related cataract, morgagnian type, right eye: Secondary | ICD-10-CM | POA: Diagnosis not present

## 2020-10-19 DIAGNOSIS — H524 Presbyopia: Secondary | ICD-10-CM | POA: Diagnosis not present

## 2020-10-29 ENCOUNTER — Ambulatory Visit (INDEPENDENT_AMBULATORY_CARE_PROVIDER_SITE_OTHER): Payer: Medicare HMO | Admitting: Family Medicine

## 2020-10-29 ENCOUNTER — Other Ambulatory Visit: Payer: Self-pay

## 2020-10-29 VITALS — BP 114/60 | HR 82 | Wt 240.4 lb

## 2020-10-29 DIAGNOSIS — I10 Essential (primary) hypertension: Secondary | ICD-10-CM

## 2020-10-29 DIAGNOSIS — K219 Gastro-esophageal reflux disease without esophagitis: Secondary | ICD-10-CM

## 2020-10-29 DIAGNOSIS — R739 Hyperglycemia, unspecified: Secondary | ICD-10-CM | POA: Diagnosis not present

## 2020-10-29 LAB — POCT GLYCOSYLATED HEMOGLOBIN (HGB A1C): Hemoglobin A1C: 5.2 % (ref 4.0–5.6)

## 2020-10-29 MED ORDER — FAMOTIDINE 10 MG PO TABS: 10.0000 mg | ORAL_TABLET | Freq: Two times a day (BID) | ORAL | 3 refills | Status: DC

## 2020-10-29 MED ORDER — LEVOTHYROXINE SODIUM 100 MCG PO TABS: 100.0000 ug | ORAL_TABLET | Freq: Every day | ORAL | 3 refills | Status: DC

## 2020-10-29 MED ORDER — LISINOPRIL-HYDROCHLOROTHIAZIDE 20-12.5 MG PO TABS: 1.0000 | ORAL_TABLET | Freq: Every day | ORAL | 3 refills | Status: DC

## 2020-10-29 MED ORDER — CARVEDILOL 3.125 MG PO TABS: 3.1250 mg | ORAL_TABLET | Freq: Two times a day (BID) | ORAL | 3 refills | Status: DC

## 2020-10-29 NOTE — Progress Notes (Signed)
    SUBJECTIVE:   CHIEF COMPLAINT / HPI:   Medication Refills  Patient is here because she needs medication refills.  She is provided all of the medications that she needs refills on and they will be sent to the pharmacy.  Reflux  Patient continues to have issues with reflux.  She was previously on omeprazole 20 mg twice daily but this was discontinued by her provider.  She then went to [facility] and got over-the-counter omeprazole and started taking it.  She said she was unable to find the Pepcid which is what was recommended to her previously.  She feels that these medications do help with her reflux but wants to be on the right medication.  Hypertension Well-controlled on current medication regimen.  She had mildly elevated creatinine at previous exam and it appears that she has baseline CKD.   OBJECTIVE:   BP 114/60   Pulse 82   Wt 240 lb 6 oz (109 kg)   SpO2 98%   BMI 41.26 kg/m   General: Well-appearing, no acute distress, obese Respiratory: Normal work of breathing, lungs clear to auscultation bilaterally Cardiac: Regular rate and rhythm, no murmurs appreciated Abdomen: Soft, nontender, positive bowel sounds   ASSESSMENT/PLAN:   GASTROESOPHAGEAL REFLUX, NO ESOPHAGITIS Patient reports that she has been taking omeprazole over-the-counter because she could not find the Pepcid.  This was an issue for her in the past.  I have sent a prescription for Pepcid to her pharmacy.  HYPERTENSION, BENIGN SYSTEMIC Currently well controlled on chronic medications.  No plan to change at this time. -Medication refill sent to patient's pharmacy -We will check a BMP to evaluate kidney function today     Derrel Nip, MD [facility]

## 2020-10-29 NOTE — Patient Instructions (Signed)
Was great seeing you today.  I have refilled the requested medications.  I am going to check your kidney function as well as a hemoglobin A1c which is a marker for diabetes.  These results will go to your MyChart and if there are any abnormalities I will call you.  If you have any questions or concerns please call the clinic.  Happy of wonderful afternoon!

## 2020-10-30 LAB — BASIC METABOLIC PANEL
BUN/Creatinine Ratio: 10 — ABNORMAL LOW (ref 12–28)
BUN: 16 mg/dL (ref 8–27)
CO2: 20 mmol/L (ref 20–29)
Calcium: 9.4 mg/dL (ref 8.7–10.3)
Chloride: 107 mmol/L — ABNORMAL HIGH (ref 96–106)
Creatinine, Ser: 1.53 mg/dL — ABNORMAL HIGH (ref 0.57–1.00)
GFR calc Af Amer: 41 mL/min/{1.73_m2} — ABNORMAL LOW (ref >59)
GFR calc non Af Amer: 35 mL/min/{1.73_m2} — ABNORMAL LOW (ref >59)
Glucose: 102 mg/dL — ABNORMAL HIGH (ref 65–99)
Potassium: 4.1 mmol/L (ref 3.5–5.2)
Sodium: 143 mmol/L (ref 134–144)

## 2020-10-31 NOTE — Assessment & Plan Note (Signed)
Patient reports that she has been taking omeprazole over-the-counter because she could not find the Pepcid.  This was an issue for her in the past.  I have sent a prescription for Pepcid to her pharmacy.

## 2020-10-31 NOTE — Assessment & Plan Note (Signed)
Currently well controlled on chronic medications.  No plan to change at this time. -Medication refill sent to patient's pharmacy -We will check a BMP to evaluate kidney function today

## 2020-12-12 DIAGNOSIS — H25811 Combined forms of age-related cataract, right eye: Secondary | ICD-10-CM | POA: Diagnosis not present

## 2020-12-12 DIAGNOSIS — H2511 Age-related nuclear cataract, right eye: Secondary | ICD-10-CM | POA: Diagnosis not present

## 2020-12-12 DIAGNOSIS — H2521 Age-related cataract, morgagnian type, right eye: Secondary | ICD-10-CM | POA: Diagnosis not present

## 2021-04-24 ENCOUNTER — Other Ambulatory Visit: Payer: Self-pay

## 2021-04-24 NOTE — Patient Outreach (Signed)
Aging Gracefully Program  04/24/2021  Gwendolyn Hicks [birth year] [id number]  [facility] Evaluation Interviewer made contact with patient. Aging Gracefully initial survey scheduled for 04/30/21 at 11 o'clock.  [facility] Care Management Assistant  [phone]

## 2021-04-30 ENCOUNTER — Other Ambulatory Visit: Payer: Self-pay

## 2021-04-30 NOTE — Patient Outreach (Signed)
Aging Gracefully Program  04/30/2021  Gwendolyn Hicks November 05, 1955 [id number]  [facility] Evaluation Interviewer made contact with patient. Aging Gracefully initial survey completed.   Interviewer will send referral to Rowe Pavy, RN and OT for follow up.   [facility] Care Management Assistant  [phone]

## 2021-05-16 ENCOUNTER — Encounter: Payer: Self-pay | Admitting: Occupational Therapy

## 2021-05-22 ENCOUNTER — Encounter: Payer: Self-pay | Admitting: Occupational Therapy

## 2021-10-11 ENCOUNTER — Other Ambulatory Visit: Payer: Self-pay

## 2021-10-11 DIAGNOSIS — I10 Essential (primary) hypertension: Secondary | ICD-10-CM

## 2021-10-11 MED ORDER — LISINOPRIL-HYDROCHLOROTHIAZIDE 20-12.5 MG PO TABS: 1.0000 | ORAL_TABLET | Freq: Every day | ORAL | 3 refills | Status: DC

## 2022-01-08 ENCOUNTER — Other Ambulatory Visit: Payer: Self-pay | Admitting: Family Medicine

## 2022-01-08 DIAGNOSIS — I10 Essential (primary) hypertension: Secondary | ICD-10-CM

## 2022-02-11 ENCOUNTER — Encounter: Payer: Self-pay | Admitting: *Deleted

## 2022-06-03 ENCOUNTER — Other Ambulatory Visit: Payer: Self-pay | Admitting: Family Medicine

## 2022-06-13 ENCOUNTER — Other Ambulatory Visit: Payer: Self-pay | Admitting: Family Medicine

## 2022-06-13 DIAGNOSIS — I10 Essential (primary) hypertension: Secondary | ICD-10-CM

## 2022-06-25 ENCOUNTER — Other Ambulatory Visit: Payer: Self-pay

## 2022-06-25 DIAGNOSIS — I10 Essential (primary) hypertension: Secondary | ICD-10-CM

## 2022-06-25 DIAGNOSIS — E039 Hypothyroidism, unspecified: Secondary | ICD-10-CM

## 2022-06-25 MED ORDER — LISINOPRIL-HYDROCHLOROTHIAZIDE 20-12.5 MG PO TABS: 1.0000 | ORAL_TABLET | Freq: Every day | ORAL | 3 refills | Status: DC

## 2022-06-25 MED ORDER — LEVOTHYROXINE SODIUM 100 MCG PO TABS: 100.0000 ug | ORAL_TABLET | Freq: Every day | ORAL | 3 refills | Status: DC

## 2022-06-25 MED ORDER — CARVEDILOL 3.125 MG PO TABS: 3.1250 mg | ORAL_TABLET | Freq: Two times a day (BID) | ORAL | 1 refills | Status: DC

## 2022-06-25 NOTE — Telephone Encounter (Signed)
Pt changing pharmacy from [facility] to [facility]. Ottis Stain, CMA

## 2022-12-28 ENCOUNTER — Emergency Department (HOSPITAL_COMMUNITY): Payer: Medicare HMO

## 2022-12-28 ENCOUNTER — Other Ambulatory Visit: Payer: Self-pay

## 2022-12-28 ENCOUNTER — Emergency Department (HOSPITAL_COMMUNITY)
Admission: EM | Admit: 2022-12-28 | Discharge: 2022-12-28 | Disposition: A | Discharge status: Home / Self Care | Payer: Medicare HMO | Attending: Emergency Medicine | Admitting: Emergency Medicine

## 2022-12-28 DIAGNOSIS — I10 Essential (primary) hypertension: Secondary | ICD-10-CM | POA: Insufficient documentation

## 2022-12-28 DIAGNOSIS — R197 Diarrhea, unspecified: Secondary | ICD-10-CM | POA: Diagnosis not present

## 2022-12-28 DIAGNOSIS — R112 Nausea with vomiting, unspecified: Secondary | ICD-10-CM | POA: Insufficient documentation

## 2022-12-28 DIAGNOSIS — K8020 Calculus of gallbladder without cholecystitis without obstruction: Secondary | ICD-10-CM | POA: Diagnosis not present

## 2022-12-28 DIAGNOSIS — R319 Hematuria, unspecified: Secondary | ICD-10-CM | POA: Insufficient documentation

## 2022-12-28 DIAGNOSIS — Z1152 Encounter for screening for COVID-19: Secondary | ICD-10-CM | POA: Insufficient documentation

## 2022-12-28 DIAGNOSIS — K5730 Diverticulosis of large intestine without perforation or abscess without bleeding: Secondary | ICD-10-CM | POA: Diagnosis not present

## 2022-12-28 DIAGNOSIS — R109 Unspecified abdominal pain: Secondary | ICD-10-CM | POA: Diagnosis not present

## 2022-12-28 LAB — COMPREHENSIVE METABOLIC PANEL
ALT: 18 U/L (ref 0–44)
AST: 24 U/L (ref 15–41)
Albumin: 4 g/dL (ref 3.5–5.0)
Alkaline Phosphatase: 70 U/L (ref 38–126)
Anion gap: 10 (ref 5–15)
BUN: 32 mg/dL — ABNORMAL HIGH (ref 8–23)
CO2: 17 mmol/L — ABNORMAL LOW (ref 22–32)
Calcium: 9 mg/dL (ref 8.9–10.3)
Chloride: 109 mmol/L (ref 98–111)
Creatinine, Ser: 1.65 mg/dL — ABNORMAL HIGH (ref 0.44–1.00)
GFR, Estimated: 34 mL/min — ABNORMAL LOW (ref >60)
Glucose, Bld: 133 mg/dL — ABNORMAL HIGH (ref 70–99)
Potassium: 3.7 mmol/L (ref 3.5–5.1)
Sodium: 136 mmol/L (ref 135–145)
Total Bilirubin: 0.3 mg/dL (ref 0.3–1.2)
Total Protein: 8.1 g/dL (ref 6.5–8.1)

## 2022-12-28 LAB — URINALYSIS, ROUTINE W REFLEX MICROSCOPIC
Bilirubin Urine: NEGATIVE
Glucose, UA: NEGATIVE mg/dL
Ketones, ur: NEGATIVE mg/dL
Leukocytes,Ua: NEGATIVE
Nitrite: NEGATIVE
Protein, ur: NEGATIVE mg/dL
Specific Gravity, Urine: 1.011 (ref 1.005–1.030)
pH: 5 (ref 5.0–8.0)

## 2022-12-28 LAB — CBC
HCT: 32.4 % — ABNORMAL LOW (ref 36.0–46.0)
Hemoglobin: 10.7 g/dL — ABNORMAL LOW (ref 12.0–15.0)
MCH: 28.6 pg (ref 26.0–34.0)
MCHC: 33 g/dL (ref 30.0–36.0)
MCV: 86.6 fL (ref 80.0–100.0)
Platelets: 219 10*3/uL (ref 150–400)
RBC: 3.74 MIL/uL — ABNORMAL LOW (ref 3.87–5.11)
RDW: 13.1 % (ref 11.5–15.5)
WBC: 9.3 10*3/uL (ref 4.0–10.5)
nRBC: 0 % (ref 0.0–0.2)

## 2022-12-28 LAB — RESP PANEL BY RT-PCR (RSV, FLU A&B, COVID)  RVPGX2
Influenza A by PCR: NEGATIVE
Influenza B by PCR: NEGATIVE
Resp Syncytial Virus by PCR: NEGATIVE
SARS Coronavirus 2 by RT PCR: NEGATIVE

## 2022-12-28 LAB — I-STAT CHEM 8, ED
BUN: 37 mg/dL — ABNORMAL HIGH (ref 8–23)
Calcium, Ion: 1.24 mmol/L (ref 1.15–1.40)
Chloride: 110 mmol/L (ref 98–111)
Creatinine, Ser: 1.6 mg/dL — ABNORMAL HIGH (ref 0.44–1.00)
Glucose, Bld: 154 mg/dL — ABNORMAL HIGH (ref 70–99)
HCT: 35 % — ABNORMAL LOW (ref 36.0–46.0)
Hemoglobin: 11.9 g/dL — ABNORMAL LOW (ref 12.0–15.0)
Potassium: 4.6 mmol/L (ref 3.5–5.1)
Sodium: 139 mmol/L (ref 135–145)
TCO2: 22 mmol/L (ref 22–32)

## 2022-12-28 MED ORDER — DICYCLOMINE HCL 10 MG PO CAPS
20.0000 mg | ORAL_CAPSULE | Freq: Once | ORAL | Status: AC
  Administered 2022-12-28: 20 mg via ORAL
  Filled 2022-12-28: qty 2

## 2022-12-28 MED ORDER — ONDANSETRON HCL 4 MG/2ML IJ SOLN
4.0000 mg | Freq: Once | INTRAMUSCULAR | Status: AC
  Administered 2022-12-28: 4 mg via INTRAVENOUS
  Filled 2022-12-28: qty 2

## 2022-12-28 MED ORDER — ONDANSETRON 4 MG PO TBDP: 4.0000 mg | ORAL_TABLET | Freq: Three times a day (TID) | ORAL | 0 refills | Status: AC | PRN

## 2022-12-28 MED ORDER — DICYCLOMINE HCL 20 MG PO TABS: 20.0000 mg | ORAL_TABLET | Freq: Two times a day (BID) | ORAL | 0 refills | Status: AC

## 2022-12-28 MED ORDER — FAMOTIDINE 20 MG PO TABS: 20.0000 mg | ORAL_TABLET | Freq: Every day | ORAL | 0 refills | Status: AC

## 2022-12-28 MED ORDER — ONDANSETRON 4 MG PO TBDP
4.0000 mg | ORAL_TABLET | Freq: Once | ORAL | Status: AC | PRN
  Administered 2022-12-28: 4 mg via ORAL
  Filled 2022-12-28: qty 1

## 2022-12-28 MED ORDER — SODIUM CHLORIDE 0.9 % IV BOLUS
500.0000 mL | Freq: Once | INTRAVENOUS | Status: AC
  Administered 2022-12-28: 500 mL via INTRAVENOUS

## 2022-12-28 NOTE — ED Provider Notes (Signed)
[place] EMERGENCY DEPARTMENT AT [facility] Provider Note   CSN: [id number] Arrival date & time: 12/28/22  4098     History {Add pertinent medical, surgical, social history, OB history to HPI:1} Chief Complaint  Patient presents with   Diarrhea   Nausea    Gwendolyn Hicks is a 67 y.o. female.   Diarrhea      Home Medications Prior to Admission medications   Medication Sig Start Date End Date Taking? Authorizing Provider  dicyclomine (BENTYL) 20 MG tablet Take 1 tablet (20 mg total) by mouth 2 (two) times daily. 12/28/22  Yes Genifer Lazenby S, PA  famotidine (PEPCID) 20 MG tablet Take 1 tablet (20 mg total) by mouth daily. 12/28/22  Yes Whittley Carandang S, PA  ondansetron (ZOFRAN-ODT) 4 MG disintegrating tablet Take 1 tablet (4 mg total) by mouth every 8 (eight) hours as needed for nausea or vomiting. 12/28/22  Yes Margarethe Virgen S, PA  aspirin (ANACIN) 81 MG EC tablet Take 81 mg by mouth daily.     [provider]  carvedilol (COREG) 3.125 MG tablet Take 1 tablet (3.125 mg total) by mouth 2 (two) times daily with a meal. 06/25/22   Glendale Chard, DO  levothyroxine (SYNTHROID) 100 MCG tablet Take 1 tablet (100 mcg total) by mouth daily. 06/25/22   Glendale Chard, DO  lisinopril-hydrochlorothiazide (ZESTORETIC) 20-12.5 MG tablet Take 1 tablet by mouth daily. 06/25/22   Glendale Chard, DO  Misc. Devices (PULSE OXIMETER) MISC 1 Act by Does not apply route as needed. 03/29/19   Rolly Salter, MD  omeprazole (PRILOSEC OTC) 20 MG tablet Take 20 mg by mouth 2 (two) times a day.    [provider]  predniSONE (DELTASONE) 20 MG tablet Take 2 tablets (40 mg total) by mouth daily with breakfast. For the next four days Patient not taking: Reported on 11/18/2019 11/10/19   Gerhard Munch, MD  Thermometer MISC 1 Act by Does not apply route as needed. 03/29/19   Rolly Salter, MD  vitamin C (ASCORBIC ACID) 500 MG tablet Take 0.5 tablets (250 mg total) by mouth  daily. Patient not taking: Reported on 11/10/2019 03/29/19   Rolly Salter, MD  Zinc 220 (50 Zn) MG CAPS Take 1 tablet by mouth daily. Patient not taking: Reported on 11/10/2019 03/29/19   Rolly Salter, MD      Allergies    Diphenhydramine hcl    Review of Systems   Review of Systems  Gastrointestinal:  Positive for diarrhea.    Physical Exam Updated Vital Signs BP (!) 169/87   Pulse 85   Temp 97.7 F (36.5 C) (Oral)   Resp 17   Ht 5\' 4"  (1.626 m)   Wt 111.1 kg   SpO2 96%   BMI 42.05 kg/m  Physical Exam  ED Results / Procedures / Treatments   Labs (all labs ordered are listed, but only abnormal results are displayed) Labs Reviewed  COMPREHENSIVE METABOLIC PANEL - Abnormal; Notable for the following components:      Result Value   CO2 17 (*)    Glucose, Bld 133 (*)    BUN 32 (*)    Creatinine, Ser 1.65 (*)    GFR, Estimated 34 (*)    All other components within normal limits  CBC - Abnormal; Notable for the following components:   RBC 3.74 (*)    Hemoglobin 10.7 (*)    HCT 32.4 (*)    All other components within normal limits  URINALYSIS, ROUTINE W REFLEX MICROSCOPIC - Abnormal; Notable for the following components:   Color, Urine STRAW (*)    Hgb urine dipstick SMALL (*)    Bacteria, UA RARE (*)    All other components within normal limits  I-STAT CHEM 8, ED - Abnormal; Notable for the following components:   BUN 37 (*)    Creatinine, Ser 1.60 (*)    Glucose, Bld 154 (*)    Hemoglobin 11.9 (*)    HCT 35.0 (*)    All other components within normal limits  RESP PANEL BY RT-PCR (RSV, FLU A&B, COVID)  [id number]    EKG None  Radiology CT Renal Stone Study  Result Date: 12/28/2022 CLINICAL DATA:  Flank pain.  Stone suspected. EXAM: CT ABDOMEN AND PELVIS WITHOUT CONTRAST TECHNIQUE: Multidetector CT imaging of the abdomen and pelvis was performed following the standard protocol without IV contrast. RADIATION DOSE REDUCTION: This exam was performed according to  the departmental dose-optimization program which includes automated exposure control, adjustment of the mA and/or kV according to patient size and/or use of iterative reconstruction technique. COMPARISON:  None Available. FINDINGS: Lower chest: Lung bases show mild scarring changes without infiltrates. The cardiac size is normal. Small hiatal hernia. Hepatobiliary: There is homogeneous noncontrast attenuation of the liver. No masses seen without contrast. A single 1 cm stone is noted in the proximal gallbladder. There is no wall thickening or biliary dilatation. Pancreas: Unremarkable without contrast. Spleen: Unremarkable without contrast.  No splenomegaly. Adrenals/Urinary Tract: There is no adrenal mass. The bilateral unenhanced renal cortex is unremarkable. There is no evidence of urinary stones or obstruction. There are scattered gonadal vein phleboliths on the left but they are all anterior to the plane of the left ureter. Multiple bilateral pelvic phleboliths are also outside the plane of the ureters. The bladder is unremarkable. Stomach/Bowel: The gastric wall and unopacified small bowel as well as the appendix are unremarkable. There is fluid in the ascending and transverse colon. Left-sided diverticula. No evidence of focal colitis or diverticulitis. Vascular/Lymphatic: Aortic atherosclerosis. No enlarged abdominal or pelvic lymph nodes. Reproductive: Uterus and bilateral adnexa are unremarkable. Other: There is mild rectus diastasis at the umbilicus and mild outward protrusion, but no incarcerated hernia. There is no free air, free hemorrhage, or free fluid. Musculoskeletal: There is spondylosis and bridging syndesmophytes in the lower thoracic spine, marginal osteophytosis in the lumbar spine with bridging at L4-5 to the left. Lumbar facet hypertrophy is seen as well as mild hip DJD. No acute or other significant osseous findings. IMPRESSION: 1. No evidence of urinary stones or obstruction. 2. Fluid in  the ascending and transverse colon which could be due to a diarrheal illness or laxative use. No wall thickening. 3. Diverticulosis without evidence of diverticulitis. 4. Cholelithiasis. 5. Aortic atherosclerosis. 6. Small hiatal hernia. 7. Rectus diastasis at the umbilicus with mild outward protrusion but no incarcerated hernia. Aortic Atherosclerosis (ICD10-I70.0). Electronically Signed   By: Almira Bar M.D.   On: 12/28/2022 06:35    Procedures Procedures  {Document cardiac monitor, telemetry assessment procedure when appropriate:1}  Medications Ordered in ED Medications  ondansetron (ZOFRAN-ODT) disintegrating tablet 4 mg (4 mg Oral Given 12/28/22 0322)  dicyclomine (BENTYL) capsule 20 mg (20 mg Oral Given 12/28/22 0630)  sodium chloride 0.9 % bolus 500 mL (500 mLs Intravenous New Bag/Given 12/28/22 0632)  ondansetron (ZOFRAN) injection 4 mg (4 mg Intravenous Given 12/28/22 1610)    ED Course/ Medical Decision Making/ A&P Clinical Course as of 12/28/22 [phone]  Wynelle Link Dec 28, 2022  7829 Thursday AM diarrhea x3 per day. Water. Nausea throughout the day but no vomiting.    States sour belches. Recently ate burgers that sat out for 2 days.  No recent abx, no fevers.  [WF]  5621 Lipase, blood [WF]    Clinical Course User Index [WF] Gailen Shelter, PA   {   Click here for ABCD2, HEART and other calculatorsREFRESH Note before signing :1}                          Medical Decision Making Amount and/or Complexity of Data Reviewed Labs: ordered. Decision-making details documented in ED Course. Radiology: ordered.  Risk Prescription drug management.   ***  {Document critical care time when appropriate:1} {Document review of labs and clinical decision tools ie heart score, Chads2Vasc2 etc:1}  {Document your independent review of radiology images, and any outside records:1} {Document your discussion with family members, caretakers, and with consultants:1} {Document social determinants  of health affecting pt's care:1} {Document your decision making why or why not admission, treatments were needed:1} Final Clinical Impression(s) / ED Diagnoses Final diagnoses:  Nausea vomiting and diarrhea  Hematuria, unspecified type    Rx / DC Orders ED Discharge Orders          Ordered    ondansetron (ZOFRAN-ODT) 4 MG disintegrating tablet  Every 8 hours PRN        12/28/22 0728    dicyclomine (BENTYL) 20 MG tablet  2 times daily        12/28/22 0728    famotidine (PEPCID) 20 MG tablet  Daily        12/28/22 0728

## 2022-12-28 NOTE — ED Triage Notes (Signed)
C/o left lower back back with N/V/D x 3 days.

## 2022-12-28 NOTE — Discharge Instructions (Signed)
Hydrate, use zofran for nausea, bentyl twice daily for pain.  Follow up with your primary care provider.

## 2023-01-02 ENCOUNTER — Telehealth: Payer: Self-pay

## 2023-01-02 NOTE — Telephone Encounter (Signed)
     Patient  visit on 4/21  at [facility]    Have you been able to follow up with your primary care physician? Yes   The patient was or was not able to obtain any needed medicine or equipment. Yes   Are there diet recommendations that you are having difficulty following? Na   Patient expresses understanding of discharge instructions and education provided has no other needs at this time.  Yes      Lenard Forth [facility] Guide, [facility] [phone] [address] [place], [place], [place] [address] Phone: [phone] Email: [email or web address]

## 2023-01-12 ENCOUNTER — Encounter: Payer: Self-pay | Admitting: Student

## 2023-01-12 ENCOUNTER — Ambulatory Visit (INDEPENDENT_AMBULATORY_CARE_PROVIDER_SITE_OTHER): Payer: Medicare HMO | Admitting: Student

## 2023-01-12 VITALS — BP 124/62 | HR 77 | Ht 64.0 in | Wt 226.6 lb

## 2023-01-12 DIAGNOSIS — R9389 Abnormal findings on diagnostic imaging of other specified body structures: Secondary | ICD-10-CM

## 2023-01-12 DIAGNOSIS — N1832 Chronic kidney disease, stage 3b: Secondary | ICD-10-CM

## 2023-01-12 NOTE — Patient Instructions (Addendum)
It was great to see you today!   Today we addressed: Gallstones: you have a very small stone in your gallbladder (the organ that helps you digest fats). This is very common to find on imaging. If it is not bothering you there is nothing that needs to be done at this time. If you begin to have sharp pain on your right side after eating, please come in to see me.  You also have a very small hernia in your stomach that most likely is part of the cause of your acid reflux. Since your symptoms are well controlled currently and you are not having any problems, there is nothing we need to do at this time.   You should return to our clinic Return in about 3 months (around 04/14/2023) for labs.  Please arrive 15 minutes before your appointment to ensure smooth check in process.    Please call the clinic at [phone] if your symptoms worsen or you have any concerns.  Thank you for allowing me to participate in your care, Dr. Glendale Chard [facility]

## 2023-01-12 NOTE — Progress Notes (Signed)
    SUBJECTIVE:   CHIEF COMPLAINT / HPI:   Gwendolyn Hicks is a 67 y.o. female  presenting for ED follow up.  She went to the ED earlier in April for gastroenteritis.  While she was there she had a CT abdomen which showed a small hiatal hernia and a singular gallstone.  She was instructed to follow-up with PCP for recommendations.  She has not had any abdominal pain, pain with eating. Her reflux has been well-controlled with Pepcid.   CKD 3: Still making good urine, no uremic symptoms  PERTINENT  PMH / PSH: Reviewed   OBJECTIVE:   BP 124/62   Pulse 77   Ht 5\' 4"  (1.626 m)   Wt 226 lb 9.6 oz (102.8 kg)   SpO2 99%   BMI 38.90 kg/m   Well-appearing, no acute distress Cardio: Regular rate, regular rhythm, no murmurs on exam. Pulm: Clear, no wheezing, no crackles. No increased work of breathing Abdominal: bowel sounds present, soft, non-tender, non-distended Extremities: no peripheral edema    ASSESSMENT/PLAN:   Chronic kidney disease (CKD), stage III (moderate) Creatinine stable from ED labs.  Instructed patient to follow-up in 3 months for lab recheck.  Patient would benefit from SGLT2 and nephrology referral.  Discuss with patient on return.  Gallstones and hiatal hernia: Patient is asymptomatic on exam.  Both appear to be incidental findings.  No further workup or imaging required.  Return precautions discussed with patient.   Glendale Chard, DO [facility]

## 2023-01-12 NOTE — Assessment & Plan Note (Signed)
Creatinine stable from ED labs.  Instructed patient to follow-up in 3 months for lab recheck.  Patient would benefit from SGLT2 and nephrology referral.  Discuss with patient on return.

## 2023-04-06 ENCOUNTER — Other Ambulatory Visit: Payer: Self-pay

## 2023-04-06 DIAGNOSIS — E039 Hypothyroidism, unspecified: Secondary | ICD-10-CM

## 2023-04-06 DIAGNOSIS — I10 Essential (primary) hypertension: Secondary | ICD-10-CM

## 2023-04-06 MED ORDER — CARVEDILOL 3.125 MG PO TABS: 3.1250 mg | ORAL_TABLET | Freq: Two times a day (BID) | ORAL | 1 refills | Status: DC

## 2023-04-06 MED ORDER — LISINOPRIL-HYDROCHLOROTHIAZIDE 20-12.5 MG PO TABS: 1.0000 | ORAL_TABLET | Freq: Every day | ORAL | 3 refills | Status: DC

## 2023-04-06 MED ORDER — LEVOTHYROXINE SODIUM 100 MCG PO TABS: 100.0000 ug | ORAL_TABLET | Freq: Every day | ORAL | 3 refills | Status: DC

## 2023-04-20 ENCOUNTER — Ambulatory Visit (INDEPENDENT_AMBULATORY_CARE_PROVIDER_SITE_OTHER): Payer: No Typology Code available for payment source

## 2023-04-20 VITALS — Ht 64.0 in | Wt 226.0 lb

## 2023-04-20 DIAGNOSIS — Z1231 Encounter for screening mammogram for malignant neoplasm of breast: Secondary | ICD-10-CM

## 2023-04-20 DIAGNOSIS — Z0000 Encounter for general adult medical examination without abnormal findings: Secondary | ICD-10-CM

## 2023-04-20 DIAGNOSIS — Z780 Asymptomatic menopausal state: Secondary | ICD-10-CM

## 2023-04-20 NOTE — Patient Instructions (Addendum)
Gwendolyn Hicks , Thank you for taking time to come for your Medicare Wellness Visit. I appreciate your ongoing commitment to your health goals. Please review the following plan we discussed and let me know if I can assist you in the future.   Referrals/Orders/Follow-Ups/Clinician Recommendations: Aim for 30 minutes of exercise or brisk walking, 6-8 glasses of water, and 5 servings of fruits and vegetables each day.  You have an order for:  []   2D Mammogram  [x]   3D Mammogram  [x]   Bone Density     Please call for appointment:   [facility] [address] [place], [place] [address] [phone]    Make sure to wear two-piece clothing.  No lotions, powders, or deodorants the day of the appointment. Make sure to bring picture ID and insurance card.  Bring list of medications you are currently taking including any supplements.   Schedule your [place] screening mammogram through MyChart!   Log into your MyChart account.  Go to 'Visit' (or 'Appointments' if on mobile App) --> Schedule an Appointment  Under 'Select a Reason for Visit' choose the Mammogram Screening option.  Complete the pre-visit questions and select the time and place that best fits your schedule.    This is a list of the screening recommended for you and due dates:  Health Maintenance  Topic Date Due   Mammogram  11/18/2013   COVID-19 Vaccine (1 - 2023-24 season) 05/06/2023*   Zoster (Shingles) Vaccine (1 of 2) 07/21/2023*   Flu Shot  12/07/2023*   Pneumonia Vaccine (1 of 1 - PCV) 04/19/2024*   Medicare Annual Wellness Visit  04/19/2024   Colon Cancer Screening  10/09/2025   DEXA scan (bone density measurement)  Completed   Hepatitis C Screening  Completed   HPV Vaccine  Aged Out   DTaP/Tdap/Td vaccine  Discontinued  *Topic was postponed. The date shown is not the original due date.    Advanced directives: (ACP Link)Information on Advanced Care Planning can be found at [facility] Advance Health Care Directives Advance Health Care Directives ([email or web address]   Next Medicare Annual Wellness Visit scheduled for next year: Yes  Preventive Care 65 Years and Older, Female Preventive care refers to lifestyle choices and visits with your health care provider that can promote health and wellness. What does preventive care include? A yearly physical exam. This is also called an annual well check. Dental exams once or twice a year. Routine eye exams. Ask your health care provider how often you should have your eyes checked. Personal lifestyle choices, including: Daily care of your teeth and gums. Regular physical activity. Eating a healthy diet. Avoiding tobacco and drug use. Limiting alcohol use. Practicing safe sex. Taking low-dose aspirin every day. Taking vitamin and mineral supplements as recommended by your health care provider. What happens during an annual well check? The services and screenings done by your health care provider during your annual well check will depend on your age, overall health, lifestyle risk factors, and family history of disease. Counseling  Your health care provider may ask you questions about your: Alcohol use. Tobacco use. Drug use. Emotional well-being. Home and relationship well-being. Sexual activity. Eating habits. History of falls. Memory and ability to understand (cognition). Work and work Astronomer. Reproductive health. Screening  You may have the following tests or measurements: Height, weight, and BMI. Blood pressure. Lipid and cholesterol levels. These may be checked every 5 years, or more frequently if you are  over 61 years old. Skin check. Lung cancer screening. You may have this screening every year starting at age 76 if you have a 30-pack-year history of smoking and currently smoke or have quit within the past 15 years. Fecal occult blood test (FOBT) of the stool. You may have this test  every year starting at age 70. Flexible sigmoidoscopy or colonoscopy. You may have a sigmoidoscopy every 5 years or a colonoscopy every 10 years starting at age 42. Hepatitis C blood test. Hepatitis B blood test. Sexually transmitted disease (STD) testing. Diabetes screening. This is done by checking your blood sugar (glucose) after you have not eaten for a while (fasting). You may have this done every 1-3 years. Bone density scan. This is done to screen for osteoporosis. You may have this done starting at age 104. Mammogram. This may be done every 1-2 years. Talk to your health care provider about how often you should have regular mammograms. Talk with your health care provider about your test results, treatment options, and if necessary, the need for more tests. Vaccines  Your health care provider may recommend certain vaccines, such as: Influenza vaccine. This is recommended every year. Tetanus, diphtheria, and acellular pertussis (Tdap, Td) vaccine. You may need a Td booster every 10 years. Zoster vaccine. You may need this after age 36. Pneumococcal 13-valent conjugate (PCV13) vaccine. One dose is recommended after age 22. Pneumococcal polysaccharide (PPSV23) vaccine. One dose is recommended after age 7. Talk to your health care provider about which screenings and vaccines you need and how often you need them. This information is not intended to replace advice given to you by your health care provider. Make sure you discuss any questions you have with your health care provider. Document Released: 09/21/2015 Document Revised: 05/14/2016 Document Reviewed: 06/26/2015 Elsevier Interactive Patient Education  2017 [employer].  Fall Prevention in the Home Falls can cause injuries. They can happen to people of all ages. There are many things you can do to make your home safe and to help prevent falls. What can I do on the outside of my home? Regularly fix the edges of walkways and driveways  and fix any cracks. Remove anything that might make you trip as you walk through a door, such as a raised step or threshold. Trim any bushes or trees on the path to your home. Use bright outdoor lighting. Clear any walking paths of anything that might make someone trip, such as rocks or tools. Regularly check to see if handrails are loose or broken. Make sure that both sides of any steps have handrails. Any raised decks and porches should have guardrails on the edges. Have any leaves, snow, or ice cleared regularly. Use sand or salt on walking paths during winter. Clean up any spills in your garage right away. This includes oil or grease spills. What can I do in the bathroom? Use night lights. Install grab bars by the toilet and in the tub and shower. Do not use towel bars as grab bars. Use non-skid mats or decals in the tub or shower. If you need to sit down in the shower, use a plastic, non-slip stool. Keep the floor dry. Clean up any water that spills on the floor as soon as it happens. Remove soap buildup in the tub or shower regularly. Attach bath mats securely with double-sided non-slip rug tape. Do not have throw rugs and other things on the floor that can make you trip. What can I do in the  bedroom? Use night lights. Make sure that you have a light by your bed that is easy to reach. Do not use any sheets or blankets that are too big for your bed. They should not hang down onto the floor. Have a firm chair that has side arms. You can use this for support while you get dressed. Do not have throw rugs and other things on the floor that can make you trip. What can I do in the kitchen? Clean up any spills right away. Avoid walking on wet floors. Keep items that you use a lot in easy-to-reach places. If you need to reach something above you, use a strong step stool that has a grab bar. Keep electrical cords out of the way. Do not use floor polish or wax that makes floors slippery. If  you must use wax, use non-skid floor wax. Do not have throw rugs and other things on the floor that can make you trip. What can I do with my stairs? Do not leave any items on the stairs. Make sure that there are handrails on both sides of the stairs and use them. Fix handrails that are broken or loose. Make sure that handrails are as long as the stairways. Check any carpeting to make sure that it is firmly attached to the stairs. Fix any carpet that is loose or worn. Avoid having throw rugs at the top or bottom of the stairs. If you do have throw rugs, attach them to the floor with carpet tape. Make sure that you have a light switch at the top of the stairs and the bottom of the stairs. If you do not have them, ask someone to add them for you. What else can I do to help prevent falls? Wear shoes that: Do not have high heels. Have rubber bottoms. Are comfortable and fit you well. Are closed at the toe. Do not wear sandals. If you use a stepladder: Make sure that it is fully opened. Do not climb a closed stepladder. Make sure that both sides of the stepladder are locked into place. Ask someone to hold it for you, if possible. Clearly mark and make sure that you can see: Any grab bars or handrails. First and last steps. Where the edge of each step is. Use tools that help you move around (mobility aids) if they are needed. These include: Canes. Walkers. Scooters. Crutches. Turn on the lights when you go into a dark area. Replace any light bulbs as soon as they burn out. Set up your furniture so you have a clear path. Avoid moving your furniture around. If any of your floors are uneven, fix them. If there are any pets around you, be aware of where they are. Review your medicines with your doctor. Some medicines can make you feel dizzy. This can increase your chance of falling. Ask your doctor what other things that you can do to help prevent falls. This information is not intended to  replace advice given to you by your health care provider. Make sure you discuss any questions you have with your health care provider. Document Released: 06/21/2009 Document Revised: 01/31/2016 Document Reviewed: 09/29/2014 Elsevier Interactive Patient Education  2017 [employer].

## 2023-04-20 NOTE — Progress Notes (Signed)
Subjective:   Gwendolyn Hicks is a 67 y.o. female who presents for Medicare Annual (Subsequent) preventive examination.  Visit Complete: Virtual  I connected with  Gwendolyn Hicks on 04/20/23 by a audio enabled telemedicine application and verified that I am speaking with the correct person using two identifiers.  Patient Location: Home  Provider Location: Home Office  I discussed the limitations of evaluation and management by telemedicine. The patient expressed understanding and agreed to proceed.  Vital Signs: Unable to obtain new vitals due to this being a telehealth visit.  Review of Systems     Cardiac Risk Factors include: advanced age (>12men, >73 women);hypertension;dyslipidemia     Objective:    Today's Vitals   04/20/23 1338  Weight: 226 lb (102.5 kg)  Height: 5\' 4"  (1.626 m)   Body mass index is 38.79 kg/m.     04/20/2023    1:42 PM 01/12/2023    2:32 PM 12/28/2022    3:15 AM 10/29/2020    4:13 PM 11/18/2019   10:34 AM 03/27/2019    6:00 PM 03/27/2019    5:02 AM  Advanced Directives  Does Patient Have a Medical Advance Directive? No No No No No No No  Would patient like information on creating a medical advance directive? Yes (MAU/Ambulatory/Procedural Areas - Information given) No - Patient declined No - Patient declined No - Patient declined Yes (MAU/Ambulatory/Procedural Areas - Information given)  No - Patient declined    Current Medications (verified) Outpatient Encounter Medications as of 04/20/2023  Medication Sig   aspirin (ANACIN) 81 MG EC tablet Take 81 mg by mouth daily.    carvedilol (COREG) 3.125 MG tablet Take 1 tablet (3.125 mg total) by mouth 2 (two) times daily with a meal.   dicyclomine (BENTYL) 20 MG tablet Take 1 tablet (20 mg total) by mouth 2 (two) times daily.   famotidine (PEPCID) 20 MG tablet Take 1 tablet (20 mg total) by mouth daily.   levothyroxine (SYNTHROID) 100 MCG tablet Take 1 tablet (100 mcg total) by mouth daily.    lisinopril-hydrochlorothiazide (ZESTORETIC) 20-12.5 MG tablet Take 1 tablet by mouth daily.   Misc. Devices (PULSE OXIMETER) MISC 1 Act by Does not apply route as needed.   omeprazole (PRILOSEC OTC) 20 MG tablet Take 20 mg by mouth 2 (two) times a day.   ondansetron (ZOFRAN-ODT) 4 MG disintegrating tablet Take 1 tablet (4 mg total) by mouth every 8 (eight) hours as needed for nausea or vomiting. (Patient not taking: Reported on 04/20/2023)   No facility-administered encounter medications on file as of 04/20/2023.    Allergies (verified) Diphenhydramine hcl   History: Past Medical History:  Diagnosis Date   Anemia    Hypothyroidism    Past Surgical History:  Procedure Laterality Date   CESAREAN SECTION     2   EYE SURGERY     Family History  Problem Relation Age of Onset   Hypertension Mother    Heart disease Mother    Hypertension Father    Heart disease Father    Hypertension Sister    Hypertension Brother    Hypertension Daughter    Hypertension Son    Social History   Socioeconomic History   Marital status: Widowed    Spouse name: Caro Hight   Number of children: 6   Years of education: 9   Highest education level: Not on file  Occupational History   Occupation: Retired-Janitor/dry cleaning  Tobacco Use   Smoking status: Never  Smokeless tobacco: Never  Vaping Use   Vaping status: Never Used  Substance and Sexual Activity   Alcohol use: No   Drug use: No   Sexual activity: Not Currently    Comment: husband deceased August 27, 2016  Other Topics Concern   Not on file  Social History Narrative   Patient lives in [place] with her son and daughter in law and their 3 children.    Patients husband passed away 08-27-16.   Patient enjoys fishing, watching TV, spending time with family, and being an active church member.             Social Determinants of Health   Financial Resource Strain: Low Risk  (11/18/2019)   Overall Financial Resource Strain (CARDIA)     Difficulty of Paying Living Expenses: Not very hard  Food Insecurity: No Food Insecurity (04/20/2023)   Hunger Vital Sign    Worried About Running Out of Food in the Last Year: Never true    Ran Out of Food in the Last Year: Never true  Transportation Needs: No Transportation Needs (04/20/2023)   PRAPARE - Administrator, Civil Service (Medical): No    Lack of Transportation (Non-Medical): No  Physical Activity: Insufficiently Active (04/20/2023)   Exercise Vital Sign    Days of Exercise per Week: 3 days    Minutes of Exercise per Session: 30 min  Stress: No Stress Concern Present (04/20/2023)   [employer] of Occupational Health - Occupational Stress Questionnaire    Feeling of Stress : Not at all  Social Connections: Moderately Integrated (04/20/2023)   Social Connection and Isolation Panel [NHANES]    Frequency of Communication with Friends and Family: More than three times a week    Frequency of Social Gatherings with Friends and Family: Three times a week    Attends Religious Services: More than 4 times per year    Active Member of Clubs or Organizations: Yes    Attends Banker Meetings: More than 4 times per year    Marital Status: Widowed    Tobacco Counseling Counseling given: Not Answered   Clinical Intake:  Pre-visit preparation completed: Yes  Pain : No/denies pain     Diabetes: No  How often do you need to have someone help you when you read instructions, pamphlets, or other written materials from your doctor or pharmacy?: 1 - Never  Interpreter Needed?: No  Information entered by :: Kandis Fantasia LPN   Activities of Daily Living    04/20/2023    1:42 PM  In your present state of health, do you have any difficulty performing the following activities:  Hearing? 0  Vision? 0  Difficulty concentrating or making decisions? 0  Walking or climbing stairs? 0  Dressing or bathing? 0  Doing errands, shopping? 0  Preparing Food and  eating ? N  Using the Toilet? N  In the past six months, have you accidently leaked urine? N  Do you have problems with loss of bowel control? N  Managing your Medications? N  Managing your Finances? N  Housekeeping or managing your Housekeeping? N    Patient Care Team: Glendale Chard, DO as PCP - General (Family Medicine) Graylin Shiver, MD as Consulting Physician (Gastroenterology) Sinda Du, MD as Consulting Physician (Ophthalmology)  Indicate any recent Medical Services you may have received from other than [facility] providers in the past year (date may be approximate).     Assessment:   This is a routine wellness  examination for [facility].  Hearing/Vision screen Hearing Screening - Comments:: Denies hearing difficulties   Vision Screening - Comments:: Wears rx glasses - up to date with routine eye exams with Dr. Cathey Endow    Dietary issues and exercise activities discussed:     Goals Addressed             This Visit's Progress    Remain active and independent        Depression Screen    04/20/2023    1:41 PM 01/12/2023    2:41 PM 04/30/2021   11:03 AM 10/29/2020    4:13 PM 12/08/2019   12:00 PM 11/18/2019   10:35 AM 11/18/2019   10:30 AM  PHQ 2/9 Scores  PHQ - 2 Score 0 0 2 0 0 2 2  PHQ- 9 Score  2 5 0       Fall Risk    04/20/2023    1:42 PM 01/12/2023    2:32 PM 10/29/2020    4:14 PM 11/18/2019   10:35 AM 05/18/2019    1:51 PM  Fall Risk   Falls in the past year? 0 0 0 0 0  Number falls in past yr: 0  0  0  Injury with Fall? 0  0    Risk for fall due to : No Fall Risks      Follow up Falls prevention discussed;Education provided;Falls evaluation completed    Falls evaluation completed    MEDICARE RISK AT HOME:  Medicare Risk at Home - 04/20/23 1342     Any stairs in or around the home? No    If so, are there any without handrails? No    Home free of loose throw rugs in walkways, pet beds, electrical cords, etc? Yes    Adequate lighting in your home to reduce  risk of falls? Yes    Life alert? No    Use of a cane, walker or w/c? No    Grab bars in the bathroom? No    Shower chair or bench in shower? Yes    Elevated toilet seat or a handicapped toilet? No             TIMED UP AND GO:  Was the test performed?  No    Cognitive Function:    08/25/2011    3:00 PM  MMSE - Mini Mental State Exam  Orientation to time 5  Orientation to Place 5  Registration 3  Attention/ Calculation 2  Recall 1  Language- name 2 objects 2  Language- repeat 1  Language- follow 3 step command 3  Language- read & follow direction 1  Write a sentence 1  Copy design 0  Total score 24        04/20/2023    1:42 PM 11/18/2019   10:38 AM  6CIT Screen  What Year? 0 points 0 points  What month? 0 points 0 points  What time? 0 points 0 points  Count back from 20 0 points 0 points  Months in reverse 2 points 2 points  Repeat phrase 2 points 4 points  Total Score 4 points 6 points    Immunizations Immunization History  Administered Date(s) Administered   Td 11/06/2001    TDAP status: Due, Education has been provided regarding the importance of this vaccine. Advised may receive this vaccine at local pharmacy or Health Dept. Aware to provide a copy of the vaccination record if obtained from local pharmacy or Health Dept. Verbalized acceptance and  understanding.  Flu Vaccine status: Declined, Education has been provided regarding the importance of this vaccine but patient still declined. Advised may receive this vaccine at local pharmacy or Health Dept. Aware to provide a copy of the vaccination record if obtained from local pharmacy or Health Dept. Verbalized acceptance and understanding.  Pneumococcal vaccine status: Declined,  Education has been provided regarding the importance of this vaccine but patient still declined. Advised may receive this vaccine at local pharmacy or Health Dept. Aware to provide a copy of the vaccination record if obtained from  local pharmacy or Health Dept. Verbalized acceptance and understanding.   Covid-19 vaccine status: Declined, Education has been provided regarding the importance of this vaccine but patient still declined. Advised may receive this vaccine at local pharmacy or Health Dept.or vaccine clinic. Aware to provide a copy of the vaccination record if obtained from local pharmacy or Health Dept. Verbalized acceptance and understanding.  Qualifies for Shingles Vaccine? Yes   Zostavax completed No   Shingrix Completed?: No.    Education has been provided regarding the importance of this vaccine. Patient has been advised to call insurance company to determine out of pocket expense if they have not yet received this vaccine. Advised may also receive vaccine at local pharmacy or Health Dept. Verbalized acceptance and understanding.  Screening Tests Health Maintenance  Topic Date Due   MAMMOGRAM  11/18/2013   COVID-19 Vaccine (1 - 2023-24 season) 05/06/2023 (Originally 05/09/2022)   Zoster Vaccines- Shingrix (1 of 2) 07/21/2023 (Originally 05/03/2005)   INFLUENZA VACCINE  12/07/2023 (Originally 04/09/2023)   Pneumonia Vaccine 47+ Years old (1 of 1 - PCV) 04/19/2024 (Originally 05/03/2020)   Medicare Annual Wellness (AWV)  04/19/2024   Colonoscopy  10/09/2025   DEXA SCAN  Completed   Hepatitis C Screening  Completed   HPV VACCINES  Aged Out   DTaP/Tdap/Td  Discontinued    Health Maintenance  Health Maintenance Due  Topic Date Due   MAMMOGRAM  11/18/2013    Colorectal cancer screening: Type of screening: Colonoscopy. Completed 10/10/15. Repeat every 10 years  Mammogram status: Ordered today. Pt provided with contact info and advised to call to schedule appt.   Bone Density status: Ordered today. Pt provided with contact info and advised to call to schedule appt.  Lung Cancer Screening: (Low Dose CT Chest recommended if Age 66-80 years, 20 pack-year currently smoking OR have quit w/in 15years.) does not  qualify.   Lung Cancer Screening Referral: n/a  Additional Screening:  Hepatitis C Screening: does qualify; Completed 05/18/19  Vision Screening: Recommended annual ophthalmology exams for early detection of glaucoma and other disorders of the eye. Is the patient up to date with their annual eye exam?  Yes  Who is the provider or what is the name of the office in which the patient attends annual eye exams? Dr. Cathey Endow  If pt is not established with a provider, would they like to be referred to a provider to establish care? No .   Dental Screening: Recommended annual dental exams for proper oral hygiene  Community Resource Referral / Chronic Care Management: CRR required this visit?  No   CCM required this visit?  No     Plan:     I have personally reviewed and noted the following in the patient's chart:   Medical and social history Use of alcohol, tobacco or illicit drugs  Current medications and supplements including opioid prescriptions. Patient is not currently taking opioid prescriptions. Functional ability and status  Nutritional status Physical activity Advanced directives List of other physicians Hospitalizations, surgeries, and ER visits in previous 12 months Vitals Screenings to include cognitive, depression, and falls Referrals and appointments  In addition, I have reviewed and discussed with patient certain preventive protocols, quality metrics, and best practice recommendations. A written personalized care plan for preventive services as well as general preventive health recommendations were provided to patient.     Kandis Fantasia [place], [place]   9/60/4540   After Visit Summary: (Mail) Due to this being a telephonic visit, the after visit summary with patients personalized plan was offered to patient via mail   Nurse Notes: No concerns at this time

## 2023-07-15 ENCOUNTER — Ambulatory Visit: Payer: Medicare Other

## 2023-07-16 ENCOUNTER — Ambulatory Visit: Payer: Medicare Other

## 2024-03-02 ENCOUNTER — Other Ambulatory Visit: Payer: Self-pay | Admitting: Student

## 2024-03-02 DIAGNOSIS — I10 Essential (primary) hypertension: Secondary | ICD-10-CM

## 2024-03-18 ENCOUNTER — Ambulatory Visit: Admitting: Student

## 2024-03-30 ENCOUNTER — Other Ambulatory Visit: Payer: Self-pay | Admitting: Student

## 2024-03-30 DIAGNOSIS — I10 Essential (primary) hypertension: Secondary | ICD-10-CM

## 2024-03-30 NOTE — Telephone Encounter (Signed)
 Patient needs an appointment for BP check up. Will refill BP meds for now.  Admin team: can we get this scheduled? Thank you!

## 2024-04-25 ENCOUNTER — Ambulatory Visit: Admitting: Student

## 2024-04-26 ENCOUNTER — Other Ambulatory Visit: Payer: Self-pay | Admitting: Student

## 2024-04-26 DIAGNOSIS — I10 Essential (primary) hypertension: Secondary | ICD-10-CM

## 2024-05-03 ENCOUNTER — Ambulatory Visit (INDEPENDENT_AMBULATORY_CARE_PROVIDER_SITE_OTHER): Admitting: Student

## 2024-05-03 ENCOUNTER — Encounter: Payer: Self-pay | Admitting: Student

## 2024-05-03 VITALS — BP 146/72 | HR 75 | Wt 218.0 lb

## 2024-05-03 DIAGNOSIS — N1832 Chronic kidney disease, stage 3b: Secondary | ICD-10-CM

## 2024-05-03 DIAGNOSIS — Z1231 Encounter for screening mammogram for malignant neoplasm of breast: Secondary | ICD-10-CM | POA: Diagnosis not present

## 2024-05-03 NOTE — Assessment & Plan Note (Signed)
 Repeat Bmp today  Discussed nephrology referral

## 2024-05-03 NOTE — Progress Notes (Signed)
    SUBJECTIVE:   CHIEF COMPLAINT / HPI:   MALEIGHA COLVARD is a 68 y.o. female presenting for follow up of CKD. She is doing well. With no reported problems.   PERTINENT  PMH / PSH: reviewed and updated.  OBJECTIVE:   BP (!) 146/72   Pulse 75   Wt 218 lb (98.9 kg)   SpO2 98%   BMI 37.42 kg/m   Well-appearing, no acute distress Cardio: Regular rate, regular rhythm, no murmurs on exam. Pulm: Clear, no wheezing, no crackles. No increased work of breathing Abdominal: bowel sounds present, soft, non-tender, non-distended Extremities: no peripheral edema  Neuro: alert and oriented x3, speech normal in content, no facial asymmetry, strength intact and equal bilaterally in UE and LE, pupils equal and reactive to light.   ASSESSMENT/PLAN:   Assessment & Plan Stage 3b chronic kidney disease (HCC) Repeat Bmp today  Discussed nephrology referral  Screening mammogram for breast cancer Mammogram ordered today    Due to Medicare Annual Wellness visit, message sent to front office   Damien Pinal, DO [facility]

## 2024-05-03 NOTE — Patient Instructions (Addendum)
 It was great to see you today!   I will send you a message with the lab results.   You will need a mammogram   No future appointments.  Please arrive 15 minutes before your appointment to ensure smooth check in process.    Please call the clinic at [phone] if your symptoms worsen or you have any concerns.  Thank you for allowing me to participate in your care, Dr. Damien Pinal [facility]

## 2024-05-04 LAB — BASIC METABOLIC PANEL WITH GFR
BUN/Creatinine Ratio: 13 (ref 12–28)
BUN: 20 mg/dL (ref 8–27)
CO2: 22 mmol/L (ref 20–29)
Calcium: 9.8 mg/dL (ref 8.7–10.3)
Chloride: 102 mmol/L (ref 96–106)
Creatinine, Ser: 1.53 mg/dL — ABNORMAL HIGH (ref 0.57–1.00)
Glucose: 116 mg/dL — ABNORMAL HIGH (ref 70–99)
Potassium: 4.3 mmol/L (ref 3.5–5.2)
Sodium: 138 mmol/L (ref 134–144)
eGFR: 37 mL/min/1.73 — ABNORMAL LOW (ref >59)

## 2024-05-05 ENCOUNTER — Ambulatory Visit: Payer: Self-pay | Admitting: Student

## 2024-05-21 ENCOUNTER — Other Ambulatory Visit: Payer: Self-pay | Admitting: Family Medicine

## 2024-05-21 DIAGNOSIS — I10 Essential (primary) hypertension: Secondary | ICD-10-CM

## 2024-06-28 ENCOUNTER — Other Ambulatory Visit: Payer: Self-pay | Admitting: Student

## 2024-06-28 DIAGNOSIS — E039 Hypothyroidism, unspecified: Secondary | ICD-10-CM

## 2024-06-29 DIAGNOSIS — F17221 Nicotine dependence, chewing tobacco, in remission: Secondary | ICD-10-CM | POA: Diagnosis not present

## 2024-06-29 DIAGNOSIS — E039 Hypothyroidism, unspecified: Secondary | ICD-10-CM | POA: Diagnosis not present

## 2024-06-29 DIAGNOSIS — Z6837 Body mass index (BMI) 37.0-37.9, adult: Secondary | ICD-10-CM | POA: Diagnosis not present

## 2024-06-29 DIAGNOSIS — N1832 Chronic kidney disease, stage 3b: Secondary | ICD-10-CM | POA: Diagnosis not present

## 2024-06-29 DIAGNOSIS — Z008 Encounter for other general examination: Secondary | ICD-10-CM | POA: Diagnosis not present

## 2024-06-29 DIAGNOSIS — I129 Hypertensive chronic kidney disease with stage 1 through stage 4 chronic kidney disease, or unspecified chronic kidney disease: Secondary | ICD-10-CM | POA: Diagnosis not present

## 2024-09-26 ENCOUNTER — Encounter

## 2024-11-10 ENCOUNTER — Encounter
